# Patient Record
Sex: Female | Born: 1960 | Race: White | Hispanic: No | Marital: Single | Smoking: Current every day smoker
Health system: Southern US, Community
[De-identification: ages and names within clinical notes are randomized; demographics above are authoritative.]

## PROBLEM LIST (undated history)

## (undated) DIAGNOSIS — I10 Essential (primary) hypertension: Secondary | ICD-10-CM

## (undated) DIAGNOSIS — E876 Hypokalemia: Secondary | ICD-10-CM

## (undated) DIAGNOSIS — F419 Anxiety disorder, unspecified: Secondary | ICD-10-CM

## (undated) DIAGNOSIS — I219 Acute myocardial infarction, unspecified: Secondary | ICD-10-CM

## (undated) DIAGNOSIS — K219 Gastro-esophageal reflux disease without esophagitis: Secondary | ICD-10-CM

## (undated) DIAGNOSIS — B1920 Unspecified viral hepatitis C without hepatic coma: Secondary | ICD-10-CM

## (undated) DIAGNOSIS — R0902 Hypoxemia: Secondary | ICD-10-CM

## (undated) HISTORY — PX: TUBAL LIGATION: SHX77

## (undated) HISTORY — DX: Unspecified viral hepatitis C without hepatic coma: B19.20

## (undated) HISTORY — PX: CHOLECYSTECTOMY: SHX55

---

## 2012-03-02 ENCOUNTER — Other Ambulatory Visit: Payer: Self-pay

## 2012-03-02 ENCOUNTER — Inpatient Hospital Stay (HOSPITAL_COMMUNITY)
Admission: AD | Admit: 2012-03-02 | Discharge: 2012-03-15 | DRG: 234 | Disposition: A | Discharge status: Home / Self Care | Payer: Medicaid Other | Source: Other Acute Inpatient Hospital | Attending: Cardiothoracic Surgery | Admitting: Cardiothoracic Surgery

## 2012-03-02 ENCOUNTER — Inpatient Hospital Stay (HOSPITAL_COMMUNITY): Payer: Medicaid Other

## 2012-03-02 ENCOUNTER — Encounter (HOSPITAL_COMMUNITY)
Admission: AD | Disposition: A | Discharge status: Home / Self Care | Payer: Self-pay | Source: Other Acute Inpatient Hospital | Attending: Cardiothoracic Surgery

## 2012-03-02 ENCOUNTER — Encounter (HOSPITAL_COMMUNITY): Payer: Self-pay | Admitting: Pulmonary Disease

## 2012-03-02 DIAGNOSIS — E873 Alkalosis: Secondary | ICD-10-CM | POA: Diagnosis not present

## 2012-03-02 DIAGNOSIS — Z951 Presence of aortocoronary bypass graft: Secondary | ICD-10-CM

## 2012-03-02 DIAGNOSIS — I2510 Atherosclerotic heart disease of native coronary artery without angina pectoris: Secondary | ICD-10-CM

## 2012-03-02 DIAGNOSIS — I10 Essential (primary) hypertension: Secondary | ICD-10-CM | POA: Diagnosis present

## 2012-03-02 DIAGNOSIS — I9589 Other hypotension: Secondary | ICD-10-CM | POA: Diagnosis not present

## 2012-03-02 DIAGNOSIS — F419 Anxiety disorder, unspecified: Secondary | ICD-10-CM | POA: Diagnosis present

## 2012-03-02 DIAGNOSIS — Z8249 Family history of ischemic heart disease and other diseases of the circulatory system: Secondary | ICD-10-CM

## 2012-03-02 DIAGNOSIS — Z79899 Other long term (current) drug therapy: Secondary | ICD-10-CM

## 2012-03-02 DIAGNOSIS — E8779 Other fluid overload: Secondary | ICD-10-CM | POA: Diagnosis not present

## 2012-03-02 DIAGNOSIS — Z9861 Coronary angioplasty status: Secondary | ICD-10-CM

## 2012-03-02 DIAGNOSIS — I214 Non-ST elevation (NSTEMI) myocardial infarction: Principal | ICD-10-CM | POA: Diagnosis present

## 2012-03-02 DIAGNOSIS — T50995A Adverse effect of other drugs, medicaments and biological substances, initial encounter: Secondary | ICD-10-CM | POA: Diagnosis not present

## 2012-03-02 DIAGNOSIS — R0602 Shortness of breath: Secondary | ICD-10-CM

## 2012-03-02 DIAGNOSIS — J449 Chronic obstructive pulmonary disease, unspecified: Secondary | ICD-10-CM | POA: Diagnosis present

## 2012-03-02 DIAGNOSIS — Z7982 Long term (current) use of aspirin: Secondary | ICD-10-CM

## 2012-03-02 DIAGNOSIS — F411 Generalized anxiety disorder: Secondary | ICD-10-CM | POA: Diagnosis present

## 2012-03-02 DIAGNOSIS — D62 Acute posthemorrhagic anemia: Secondary | ICD-10-CM | POA: Diagnosis not present

## 2012-03-02 DIAGNOSIS — I209 Angina pectoris, unspecified: Secondary | ICD-10-CM

## 2012-03-02 DIAGNOSIS — R0609 Other forms of dyspnea: Secondary | ICD-10-CM | POA: Diagnosis present

## 2012-03-02 DIAGNOSIS — Z6837 Body mass index (BMI) 37.0-37.9, adult: Secondary | ICD-10-CM

## 2012-03-02 DIAGNOSIS — I480 Paroxysmal atrial fibrillation: Secondary | ICD-10-CM | POA: Diagnosis not present

## 2012-03-02 DIAGNOSIS — E119 Type 2 diabetes mellitus without complications: Secondary | ICD-10-CM

## 2012-03-02 DIAGNOSIS — J441 Chronic obstructive pulmonary disease with (acute) exacerbation: Secondary | ICD-10-CM | POA: Diagnosis not present

## 2012-03-02 DIAGNOSIS — E876 Hypokalemia: Secondary | ICD-10-CM | POA: Diagnosis not present

## 2012-03-02 DIAGNOSIS — IMO0001 Reserved for inherently not codable concepts without codable children: Secondary | ICD-10-CM | POA: Diagnosis present

## 2012-03-02 DIAGNOSIS — Y9210 Unspecified residential institution as the place of occurrence of the external cause: Secondary | ICD-10-CM | POA: Diagnosis not present

## 2012-03-02 DIAGNOSIS — J90 Pleural effusion, not elsewhere classified: Secondary | ICD-10-CM | POA: Diagnosis not present

## 2012-03-02 DIAGNOSIS — E669 Obesity, unspecified: Secondary | ICD-10-CM | POA: Diagnosis present

## 2012-03-02 DIAGNOSIS — I252 Old myocardial infarction: Secondary | ICD-10-CM

## 2012-03-02 DIAGNOSIS — I4891 Unspecified atrial fibrillation: Secondary | ICD-10-CM | POA: Diagnosis not present

## 2012-03-02 DIAGNOSIS — K219 Gastro-esophageal reflux disease without esophagitis: Secondary | ICD-10-CM | POA: Diagnosis present

## 2012-03-02 DIAGNOSIS — F17200 Nicotine dependence, unspecified, uncomplicated: Secondary | ICD-10-CM | POA: Diagnosis present

## 2012-03-02 HISTORY — PX: LEFT HEART CATHETERIZATION WITH CORONARY ANGIOGRAM: SHX5451

## 2012-03-02 HISTORY — DX: Anxiety disorder, unspecified: F41.9

## 2012-03-02 HISTORY — DX: Acute myocardial infarction, unspecified: I21.9

## 2012-03-02 HISTORY — PX: CARDIAC CATHETERIZATION: SHX172

## 2012-03-02 HISTORY — DX: Essential (primary) hypertension: I10

## 2012-03-02 HISTORY — DX: Gastro-esophageal reflux disease without esophagitis: K21.9

## 2012-03-02 LAB — HEMOGLOBIN A1C
Hgb A1c MFr Bld: 5.9 % — ABNORMAL HIGH (ref <5.7)
Mean Plasma Glucose: 123 mg/dL — ABNORMAL HIGH (ref <117)

## 2012-03-02 LAB — LIPID PANEL
Cholesterol: 171 mg/dL (ref 0–200)
HDL: 50 mg/dL (ref >39)
LDL Cholesterol: 107 mg/dL — ABNORMAL HIGH (ref 0–99)
Total CHOL/HDL Ratio: 3.4 RATIO
Triglycerides: 68 mg/dL (ref <150)
VLDL: 14 mg/dL (ref 0–40)

## 2012-03-02 LAB — COMPREHENSIVE METABOLIC PANEL
ALT: 19 U/L (ref 0–35)
AST: 35 U/L (ref 0–37)
CO2: 30 mEq/L (ref 19–32)
Calcium: 9 mg/dL (ref 8.4–10.5)
GFR calc non Af Amer: >90 mL/min (ref >90)
Potassium: 3.7 mEq/L (ref 3.5–5.1)
Sodium: 141 mEq/L (ref 135–145)
Total Protein: 7.1 g/dL (ref 6.0–8.3)

## 2012-03-02 LAB — TSH: TSH: 1.272 u[IU]/mL (ref 0.350–4.500)

## 2012-03-02 LAB — CBC
MCH: 29.5 pg (ref 26.0–34.0)
Platelets: 147 10*3/uL — ABNORMAL LOW (ref 150–400)
RBC: 4.64 MIL/uL (ref 3.87–5.11)

## 2012-03-02 LAB — GLUCOSE, CAPILLARY
Glucose-Capillary: 129 mg/dL — ABNORMAL HIGH (ref 70–99)
Glucose-Capillary: 167 mg/dL — ABNORMAL HIGH (ref 70–99)

## 2012-03-02 LAB — CARDIAC PANEL(CRET KIN+CKTOT+MB+TROPI)
CK, MB: 2.1 ng/mL (ref 0.3–4.0)
CK, MB: 1.9 ng/mL (ref 0.3–4.0)
Troponin I: 0.48 ng/mL (ref <0.30)
Troponin I: <0.3 ng/mL (ref <0.30)

## 2012-03-02 LAB — PRO B NATRIURETIC PEPTIDE: Pro B Natriuretic peptide (BNP): 199.2 pg/mL — ABNORMAL HIGH (ref 0–125)

## 2012-03-02 SURGERY — LEFT HEART CATHETERIZATION WITH CORONARY ANGIOGRAM
Anesthesia: LOCAL

## 2012-03-02 MED ORDER — POTASSIUM CHLORIDE 2 MEQ/ML IV SOLN
80.0000 meq | INTRAVENOUS | Status: DC
  Filled 2012-03-02: qty 40

## 2012-03-02 MED ORDER — HEPARIN (PORCINE) IN NACL 100-0.45 UNIT/ML-% IJ SOLN
950.0000 [IU]/h | INTRAMUSCULAR | Status: DC
  Administered 2012-03-02: 950 [IU]/h via INTRAVENOUS
  Filled 2012-03-02 (×2): qty 250

## 2012-03-02 MED ORDER — ALBUTEROL SULFATE (5 MG/ML) 0.5% IN NEBU
2.5000 mg | INHALATION_SOLUTION | Freq: Once | RESPIRATORY_TRACT | Status: AC
  Administered 2012-03-02: 2.5 mg via RESPIRATORY_TRACT

## 2012-03-02 MED ORDER — NITROGLYCERIN IN D5W 200-5 MCG/ML-% IV SOLN
2.0000 ug/min | INTRAVENOUS | Status: AC
  Administered 2012-03-03: 5 ug/min via INTRAVENOUS
  Filled 2012-03-02: qty 250

## 2012-03-02 MED ORDER — ONDANSETRON HCL 4 MG/2ML IJ SOLN: 4.0000 mg | Freq: Four times a day (QID) | INTRAMUSCULAR | Status: DC | PRN

## 2012-03-02 MED ORDER — IPRATROPIUM BROMIDE 0.02 % IN SOLN
0.5000 mg | Freq: Four times a day (QID) | RESPIRATORY_TRACT | Status: DC
  Administered 2012-03-02 – 2012-03-03 (×3): 0.5 mg via RESPIRATORY_TRACT
  Filled 2012-03-02 (×3): qty 2.5

## 2012-03-02 MED ORDER — SODIUM CHLORIDE 0.9 % IJ SOLN: 3.0000 mL | INTRAMUSCULAR | Status: DC | PRN

## 2012-03-02 MED ORDER — ASPIRIN 81 MG PO CHEW
81.0000 mg | CHEWABLE_TABLET | Freq: Every day | ORAL | Status: DC
  Administered 2012-03-02: 81 mg via ORAL

## 2012-03-02 MED ORDER — CHLORHEXIDINE GLUCONATE 4 % EX LIQD
60.0000 mL | Freq: Once | CUTANEOUS | Status: AC
  Administered 2012-03-03: 4 via TOPICAL
  Filled 2012-03-02: qty 60

## 2012-03-02 MED ORDER — HEPARIN BOLUS VIA INFUSION
4000.0000 [IU] | Freq: Once | INTRAVENOUS | Status: AC
  Administered 2012-03-02: 4000 [IU] via INTRAVENOUS
  Filled 2012-03-02: qty 4000

## 2012-03-02 MED ORDER — LEVOFLOXACIN IN D5W 750 MG/150ML IV SOLN
750.0000 mg | INTRAVENOUS | Status: DC
  Administered 2012-03-02: 750 mg via INTRAVENOUS
  Filled 2012-03-02: qty 150

## 2012-03-02 MED ORDER — TEMAZEPAM 15 MG PO CAPS
15.0000 mg | ORAL_CAPSULE | Freq: Once | ORAL | Status: AC | PRN
  Administered 2012-03-02: 15 mg via ORAL
  Filled 2012-03-02: qty 1

## 2012-03-02 MED ORDER — SODIUM CHLORIDE 0.9 % IV SOLN: 250.0000 mL | INTRAVENOUS | Status: DC | PRN

## 2012-03-02 MED ORDER — ALPRAZOLAM 0.5 MG PO TABS
1.0000 mg | ORAL_TABLET | Freq: Three times a day (TID) | ORAL | Status: DC | PRN
  Administered 2012-03-02: 1 mg via ORAL
  Filled 2012-03-02 (×2): qty 2

## 2012-03-02 MED ORDER — TRANEXAMIC ACID 100 MG/ML IV SOLN
1.5000 mg/kg/h | INTRAVENOUS | Status: AC
  Administered 2012-03-03: 1.5 mg/kg/h via INTRAVENOUS
  Filled 2012-03-02: qty 25

## 2012-03-02 MED ORDER — ACETAMINOPHEN 325 MG PO TABS: 650.0000 mg | ORAL_TABLET | ORAL | Status: DC | PRN

## 2012-03-02 MED ORDER — LIDOCAINE HCL (PF) 1 % IJ SOLN
INTRAMUSCULAR | Status: AC
  Filled 2012-03-02: qty 30

## 2012-03-02 MED ORDER — PHENYLEPHRINE HCL 10 MG/ML IJ SOLN
30.0000 ug/min | INTRAMUSCULAR | Status: DC
  Filled 2012-03-02: qty 2

## 2012-03-02 MED ORDER — DEXTROSE 5 % IV SOLN
1.5000 g | INTRAVENOUS | Status: AC
  Administered 2012-03-03: .75 g via INTRAVENOUS
  Administered 2012-03-03: 1.5 g via INTRAVENOUS
  Filled 2012-03-02: qty 1.5

## 2012-03-02 MED ORDER — VANCOMYCIN HCL 1000 MG IV SOLR
1500.0000 mg | INTRAVENOUS | Status: AC
  Administered 2012-03-03: 1500 mg via INTRAVENOUS
  Filled 2012-03-02: qty 1500

## 2012-03-02 MED ORDER — ASPIRIN 81 MG PO CHEW
CHEWABLE_TABLET | ORAL | Status: AC
  Administered 2012-03-02: 81 mg via ORAL
  Filled 2012-03-02: qty 3

## 2012-03-02 MED ORDER — ACETAMINOPHEN 650 MG RE SUPP: 650.0000 mg | Freq: Four times a day (QID) | RECTAL | Status: DC | PRN

## 2012-03-02 MED ORDER — ALBUTEROL SULFATE (5 MG/ML) 0.5% IN NEBU
2.5000 mg | INHALATION_SOLUTION | Freq: Four times a day (QID) | RESPIRATORY_TRACT | Status: DC
  Administered 2012-03-02 – 2012-03-03 (×3): 2.5 mg via RESPIRATORY_TRACT
  Filled 2012-03-02 (×3): qty 0.5

## 2012-03-02 MED ORDER — METOPROLOL TARTRATE 12.5 MG HALF TABLET
12.5000 mg | ORAL_TABLET | Freq: Two times a day (BID) | ORAL | Status: DC
  Administered 2012-03-02 (×2): 12.5 mg via ORAL
  Filled 2012-03-02 (×4): qty 1

## 2012-03-02 MED ORDER — NITROGLYCERIN 0.2 MG/ML ON CALL CATH LAB
INTRAVENOUS | Status: AC
  Filled 2012-03-02: qty 1

## 2012-03-02 MED ORDER — ALPRAZOLAM 0.5 MG PO TABS
1.0000 mg | ORAL_TABLET | Freq: Two times a day (BID) | ORAL | Status: DC | PRN
  Administered 2012-03-02: 1 mg via ORAL
  Filled 2012-03-02: qty 2

## 2012-03-02 MED ORDER — CHLORHEXIDINE GLUCONATE 4 % EX LIQD
CUTANEOUS | Status: AC
  Filled 2012-03-02: qty 60

## 2012-03-02 MED ORDER — ALPRAZOLAM 0.5 MG PO TABS
1.5000 mg | ORAL_TABLET | Freq: Three times a day (TID) | ORAL | Status: DC
  Administered 2012-03-02 (×2): 1.5 mg via ORAL
  Filled 2012-03-02 (×2): qty 3

## 2012-03-02 MED ORDER — TRANEXAMIC ACID (OHS) BOLUS VIA INFUSION
15.0000 mg/kg | INTRAVENOUS | Status: AC
  Administered 2012-03-03: 1314 mg via INTRAVENOUS
  Filled 2012-03-02: qty 1314

## 2012-03-02 MED ORDER — ASPIRIN EC 81 MG PO TBEC
81.0000 mg | DELAYED_RELEASE_TABLET | Freq: Every day | ORAL | Status: DC
  Administered 2012-03-02: 81 mg via ORAL
  Filled 2012-03-02 (×2): qty 1

## 2012-03-02 MED ORDER — HEPARIN (PORCINE) IN NACL 100-0.45 UNIT/ML-% IJ SOLN
950.0000 [IU]/h | INTRAMUSCULAR | Status: DC
  Filled 2012-03-02 (×2): qty 250

## 2012-03-02 MED ORDER — TRANEXAMIC ACID (OHS) PUMP PRIME SOLUTION
2.0000 mg/kg | INTRAVENOUS | Status: DC
  Filled 2012-03-02: qty 1.75

## 2012-03-02 MED ORDER — ENOXAPARIN SODIUM 40 MG/0.4ML ~~LOC~~ SOLN
40.0000 mg | Freq: Every day | SUBCUTANEOUS | Status: DC
  Filled 2012-03-02: qty 0.4

## 2012-03-02 MED ORDER — CLOPIDOGREL BISULFATE 75 MG PO TABS
75.0000 mg | ORAL_TABLET | Freq: Every day | ORAL | Status: DC
  Administered 2012-03-02: 75 mg via ORAL
  Filled 2012-03-02 (×2): qty 1

## 2012-03-02 MED ORDER — SODIUM CHLORIDE 0.9 % IV SOLN
INTRAVENOUS | Status: DC
  Administered 2012-03-02: 18:00:00 via INTRAVENOUS

## 2012-03-02 MED ORDER — SODIUM CHLORIDE 0.9 % IV SOLN: INTRAVENOUS | Status: DC

## 2012-03-02 MED ORDER — ACETAMINOPHEN 325 MG PO TABS: 650.0000 mg | ORAL_TABLET | Freq: Four times a day (QID) | ORAL | Status: DC | PRN

## 2012-03-02 MED ORDER — SODIUM CHLORIDE 0.9 % IV SOLN
INTRAVENOUS | Status: AC
  Administered 2012-03-03: 1 [IU]/h via INTRAVENOUS
  Filled 2012-03-02: qty 1

## 2012-03-02 MED ORDER — ONDANSETRON HCL 4 MG PO TABS: 4.0000 mg | ORAL_TABLET | Freq: Four times a day (QID) | ORAL | Status: DC | PRN

## 2012-03-02 MED ORDER — HYDROCODONE-ACETAMINOPHEN 5-325 MG PO TABS: 1.0000 | ORAL_TABLET | ORAL | Status: DC | PRN

## 2012-03-02 MED ORDER — ASPIRIN 81 MG PO CHEW: 324.0000 mg | CHEWABLE_TABLET | ORAL | Status: DC

## 2012-03-02 MED ORDER — HEPARIN (PORCINE) IN NACL 2-0.9 UNIT/ML-% IJ SOLN
INTRAMUSCULAR | Status: AC
  Filled 2012-03-02: qty 1000

## 2012-03-02 MED ORDER — PANTOPRAZOLE SODIUM 40 MG PO TBEC
40.0000 mg | DELAYED_RELEASE_TABLET | Freq: Every day | ORAL | Status: DC
  Administered 2012-03-02: 40 mg via ORAL
  Filled 2012-03-02: qty 1

## 2012-03-02 MED ORDER — FENTANYL CITRATE 0.05 MG/ML IJ SOLN
INTRAMUSCULAR | Status: AC
  Filled 2012-03-02: qty 2

## 2012-03-02 MED ORDER — EPINEPHRINE HCL 1 MG/ML IJ SOLN
0.5000 ug/min | INTRAVENOUS | Status: DC
  Filled 2012-03-02: qty 4

## 2012-03-02 MED ORDER — BUDESONIDE 0.5 MG/2ML IN SUSP
0.5000 mg | Freq: Two times a day (BID) | RESPIRATORY_TRACT | Status: DC
  Administered 2012-03-02 – 2012-03-15 (×24): 0.5 mg via RESPIRATORY_TRACT
  Filled 2012-03-02 (×30): qty 2

## 2012-03-02 MED ORDER — GUAIFENESIN ER 600 MG PO TB12
600.0000 mg | ORAL_TABLET | Freq: Two times a day (BID) | ORAL | Status: DC
  Administered 2012-03-02 (×2): 600 mg via ORAL
  Filled 2012-03-02 (×5): qty 1

## 2012-03-02 MED ORDER — LEVALBUTEROL HCL 0.63 MG/3ML IN NEBU
0.6300 mg | INHALATION_SOLUTION | Freq: Four times a day (QID) | RESPIRATORY_TRACT | Status: DC | PRN
  Administered 2012-03-07: 0.63 mg via RESPIRATORY_TRACT
  Filled 2012-03-02: qty 3

## 2012-03-02 MED ORDER — NITROGLYCERIN 0.1 MG/HR TD PT24
0.1000 mg | MEDICATED_PATCH | Freq: Every day | TRANSDERMAL | Status: DC
  Administered 2012-03-02: 0.1 mg via TRANSDERMAL
  Filled 2012-03-02 (×2): qty 1

## 2012-03-02 MED ORDER — NICOTINE 21 MG/24HR TD PT24
21.0000 mg | MEDICATED_PATCH | Freq: Every day | TRANSDERMAL | Status: DC
  Administered 2012-03-02 – 2012-03-15 (×13): 21 mg via TRANSDERMAL
  Filled 2012-03-02 (×14): qty 1

## 2012-03-02 MED ORDER — LORAZEPAM 2 MG/ML IJ SOLN: 1.0000 mg | INTRAMUSCULAR | Status: DC | PRN

## 2012-03-02 MED ORDER — METHYLPREDNISOLONE SODIUM SUCC 125 MG IJ SOLR
80.0000 mg | Freq: Four times a day (QID) | INTRAMUSCULAR | Status: DC
  Administered 2012-03-02: 80 mg via INTRAVENOUS
  Filled 2012-03-02 (×4): qty 1.28
  Filled 2012-03-02: qty 2

## 2012-03-02 MED ORDER — MORPHINE SULFATE 4 MG/ML IJ SOLN: 1.0000 mg | INTRAMUSCULAR | Status: DC | PRN

## 2012-03-02 MED ORDER — INSULIN GLARGINE 100 UNIT/ML ~~LOC~~ SOLN: 10.0000 [IU] | Freq: Every day | SUBCUTANEOUS | Status: DC

## 2012-03-02 MED ORDER — BISACODYL 5 MG PO TBEC: 5.0000 mg | DELAYED_RELEASE_TABLET | Freq: Once | ORAL | Status: DC

## 2012-03-02 MED ORDER — INSULIN ASPART 100 UNIT/ML ~~LOC~~ SOLN
0.0000 [IU] | Freq: Three times a day (TID) | SUBCUTANEOUS | Status: DC
Start: 2012-03-02 — End: 2012-03-03
  Administered 2012-03-02 (×2): 1 [IU] via SUBCUTANEOUS

## 2012-03-02 MED ORDER — MORPHINE SULFATE 2 MG/ML IJ SOLN: 2.0000 mg | INTRAMUSCULAR | Status: DC | PRN

## 2012-03-02 MED ORDER — DEXTROSE 5 % IV SOLN
750.0000 mg | INTRAVENOUS | Status: DC
  Filled 2012-03-02: qty 750

## 2012-03-02 MED ORDER — METOPROLOL TARTRATE 12.5 MG HALF TABLET
12.5000 mg | ORAL_TABLET | Freq: Once | ORAL | Status: AC
  Administered 2012-03-03: 12.5 mg via ORAL
  Filled 2012-03-02 (×2): qty 1

## 2012-03-02 MED ORDER — MAGNESIUM SULFATE 50 % IJ SOLN
40.0000 meq | INTRAMUSCULAR | Status: DC
  Filled 2012-03-02: qty 10

## 2012-03-02 MED ORDER — DIAZEPAM 5 MG PO TABS
5.0000 mg | ORAL_TABLET | Freq: Once | ORAL | Status: AC
  Administered 2012-03-03: 5 mg via ORAL
  Filled 2012-03-02: qty 1

## 2012-03-02 MED ORDER — SODIUM CHLORIDE 0.9 % IJ SOLN
3.0000 mL | Freq: Two times a day (BID) | INTRAMUSCULAR | Status: DC
  Administered 2012-03-02: 3 mL via INTRAVENOUS

## 2012-03-02 MED ORDER — ALBUTEROL SULFATE (5 MG/ML) 0.5% IN NEBU: 2.5000 mg | INHALATION_SOLUTION | RESPIRATORY_TRACT | Status: DC | PRN

## 2012-03-02 MED ORDER — DOPAMINE-DEXTROSE 3.2-5 MG/ML-% IV SOLN
2.0000 ug/kg/min | INTRAVENOUS | Status: AC
  Administered 2012-03-03: 3 ug/kg/min via INTRAVENOUS
  Filled 2012-03-02: qty 250

## 2012-03-02 MED ORDER — DOCUSATE SODIUM 100 MG PO CAPS
100.0000 mg | ORAL_CAPSULE | Freq: Two times a day (BID) | ORAL | Status: DC
  Administered 2012-03-02: 100 mg via ORAL
  Filled 2012-03-02 (×4): qty 1

## 2012-03-02 MED ORDER — SODIUM BICARBONATE 8.4 % IV SOLN
INTRAVENOUS | Status: AC
  Administered 2012-03-03: 10:00:00
  Filled 2012-03-02: qty 2.5

## 2012-03-02 MED ORDER — SODIUM CHLORIDE 0.9 % IJ SOLN
3.0000 mL | Freq: Two times a day (BID) | INTRAMUSCULAR | Status: DC
  Administered 2012-03-02 (×2): 3 mL via INTRAVENOUS

## 2012-03-02 MED ORDER — DEXMEDETOMIDINE HCL 100 MCG/ML IV SOLN
0.1000 ug/kg/h | INTRAVENOUS | Status: AC
  Administered 2012-03-03: .2 ug/kg/h via INTRAVENOUS
  Filled 2012-03-02: qty 4

## 2012-03-02 MED ORDER — MIDAZOLAM HCL 2 MG/2ML IJ SOLN
INTRAMUSCULAR | Status: AC
  Filled 2012-03-02: qty 2

## 2012-03-02 NOTE — Progress Notes (Signed)
ANTICOAGULATION CONSULT NOTE - Initial Consult  Pharmacy Consult for heparin Indication: s/p cath; left main disease, CABG evaluation pending  No Known Allergies  Patient Measurements: Height: 5\' 1"  (154.9 cm) Weight: 193 lb 2 oz (87.6 kg) IBW/kg (Calculated) : 47.8  Heparin Dosing Weight: 68  Vital Signs: Temp: 98.9 F (37.2 C) (05/22 1218) Temp src: Oral (05/22 1218) BP: 81/60 mmHg (05/22 1545) Pulse Rate: 85  (05/22 1545)  Labs:  Basename 03/02/12 1140 03/02/12 0415  HGB -- 13.7  HCT -- 43.1  PLT -- 147*  APTT -- --  LABPROT -- 14.2  INR -- 1.08  HEPARINUNFRC -- --  CREATININE -- 0.40*  CKTOTAL 29 29  CKMB 2.2 2.1  TROPONINI <0.30 0.48*    Estimated Creatinine Clearance: 83.7 ml/min (by C-G formula based on Cr of 0.4).   Medical History: Past Medical History  Diagnosis Date  . Hypertension   . Anxiety   . GERD (gastroesophageal reflux disease)   . Myocardial infarction     2001  . Diabetes mellitus     type 2    Medications:  Prescriptions prior to admission  Medication Sig Dispense Refill  . ALPRAZolam (XANAX) 1 MG tablet Take 1.5 mg by mouth 3 (three) times daily.        Assessment: 51 yo with a h/o CAD who underwent cath procedure today and found to have high-grade distal left main disease requiring CABG.  TCTS consult pending.  To restart IV heparin 3 hours after radial sheath removed.  Pt received Heparin 4000 unit bolus, 950 units/hr from ~ 0930-1330. Additional Heparin 4000 unit bolus given in the cath lab. Sheath removed ~ 1435.  Goal of Therapy:  Heparin level 0.3-0.7 units/ml Monitor platelets by anticoagulation protocol: Yes   Plan:  1. Restart heparin infusion at 950 units/hr (at ~ 1730 tonight) 2. F/u with 8 hr heparin level (~ 0130 tomorrow morning) 3. F/u TCTS recommendations regarding surgery.  [employer], Pharm.D., BCPS Clinical Pharmacist Pager [phone] 03/02/2012 4:33 PM

## 2012-03-02 NOTE — Plan of Care (Signed)
Problem: Consults Goal: Cardiac Surgery Patient Education ( See Patient Education module for education specifics.)  Outcome: Completed/Met Date Met:  03/02/12 Pt received cardiac surgery booklet. Pt and sister watched videos.

## 2012-03-02 NOTE — Progress Notes (Signed)
PFT done at bedside. Unconfirmed copy placed in shadow chart.  Inocente Salles RRT, RCP 03/02/2012 4:36 PM

## 2012-03-02 NOTE — Progress Notes (Signed)
Reason for Consult: Dyspnea, positive Troponin  Requesting Physician: [facility]  HPI: This is a 51 y.o. female with a past medical history significant for a history of cath/ ? Stent at [facility] in 2001, no details available. She is unemployed and her fiance that she lived with died of liver cancer in 05/16/2011. She has no insurance and has not taken any medications except antianxiety Rx for years. She smokes 2ppd. Fortunately she has not had any further cardiac problems till recently. Yesterday she went to the hospital to visit her mother when an RN on the floor became concerned because of Ms Cork wheezing. She was evaluated in the ER and her Troponin was 0.83, she had no acute changes on her EKG. She does admit to a recent history of exertional back back which is similar to her pre-PCI symptoms in 2001. Apparently no beds at [facility], or [facility] and the patient was transferred here from [facility]. She is currently breathing well and her second Troponin is 0.43.   PMHx:  Past Medical History  Diagnosis Date  . Hypertension   . Anxiety   . GERD (gastroesophageal reflux disease)   . Myocardial infarction     2001  . Diabetes mellitus     type 2   Past Surgical History  Procedure Date  . Cardiac catheterization     2001  . Cholecystectomy   . Tubal ligation     FAMHx: Family History  Problem Relation Age of Onset  . Coronary artery disease Father 59    MI/CABG    SOCHx:  reports that she has been smoking Cigarettes.  She has a 60 pack-year smoking history. She does not have any smokeless tobacco history on file. She reports that she does not drink alcohol or use illicit drugs.  ALLERGIES: No Known Allergies  ROS: No history of GI bleeding. She denies any history of diabetes or dyslipidemia. She has a long history of chronic anxiety  HOME MEDICATIONS: Prescriptions prior to admission  Medication Sig Dispense Refill  . ALPRAZolam (XANAX) 1 MG tablet  Take 1.5 mg by mouth 3 (three) times daily.        HOSPITAL MEDICATIONS: I have reviewed the patient's current medications.  VITALS: Blood pressure 99/54, pulse 81, temperature 97.9 F (36.6 C), temperature source Oral, resp. rate 28, height 5\' 1"  (1.549 m), weight 88.2 kg (194 lb 7.1 oz), SpO2 95.00%.  PHYSICAL EXAM: General appearance: alert, cooperative, no distress and morbidly obese Neck: no carotid bruit, no JVD, supple, symmetrical, trachea midline and thyroid not enlarged, symmetric, no tenderness/mass/nodules Lungs: decreased breath sounds overall with faint expiratory wheezing Heart: regular rate and rhythm, S1, S2 normal, no murmur, click, rub or gallop Abdomen: morbidly obese, not distended, non tender Extremities: no edema Pulses: 2+ and symmetric Skin: Skin color, texture, turgor normal. No rashes or lesions Neurologic: Grossly normal  LABS: Results for orders placed during the hospital encounter of 03/02/12 (from the past 48 hour(s))  MRSA PCR SCREENING     Status: Normal   Collection Time   03/02/12  2:48 AM      Component Value Range Comment   MRSA by PCR NEGATIVE  NEGATIVE    CBC     Status: Abnormal   Collection Time   03/02/12  4:15 AM      Component Value Range Comment   WBC 9.7  4.0 - 10.5 (K/uL)    RBC 4.64  3.87 - 5.11 (MIL/uL)  Hemoglobin 13.7  12.0 - 15.0 (g/dL)    HCT 16.1  09.6 - 04.5 (%)    MCV 92.9  78.0 - 100.0 (fL)    MCH 29.5  26.0 - 34.0 (pg)    MCHC 31.8  30.0 - 36.0 (g/dL)    RDW 40.9  81.1 - 91.4 (%)    Platelets 147 (*) 150 - 400 (K/uL)   COMPREHENSIVE METABOLIC PANEL     Status: Abnormal   Collection Time   03/02/12  4:15 AM      Component Value Range Comment   Sodium 141  135 - 145 (mEq/L)    Potassium 3.7  3.5 - 5.1 (mEq/L)    Chloride 104  96 - 112 (mEq/L)    CO2 30  19 - 32 (mEq/L)    Glucose, Bld 106 (*) 70 - 99 (mg/dL)    BUN 8  6 - 23 (mg/dL)    Creatinine, Ser 7.82 (*) 0.50 - 1.10 (mg/dL)    Calcium 9.0  8.4 - 10.5  (mg/dL)    Total Protein 7.1  6.0 - 8.3 (g/dL)    Albumin 3.0 (*) 3.5 - 5.2 (g/dL)    AST 35  0 - 37 (U/L)    ALT 19  0 - 35 (U/L)    Alkaline Phosphatase 96  39 - 117 (U/L)    Total Bilirubin 0.6  0.3 - 1.2 (mg/dL)    GFR calc non Af Amer >90  >90 (mL/min)    GFR calc Af Amer >90  >90 (mL/min)   MAGNESIUM     Status: Normal   Collection Time   03/02/12  4:15 AM      Component Value Range Comment   Magnesium 2.1  1.5 - 2.5 (mg/dL)   PHOSPHORUS     Status: Normal   Collection Time   03/02/12  4:15 AM      Component Value Range Comment   Phosphorus 4.5  2.3 - 4.6 (mg/dL)   PRO B NATRIURETIC PEPTIDE     Status: Abnormal   Collection Time   03/02/12  4:15 AM      Component Value Range Comment   Pro B Natriuretic peptide (BNP) 199.2 (*) 0 - 125 (pg/mL)   CARDIAC PANEL(CRET KIN+CKTOT+MB+TROPI)     Status: Abnormal   Collection Time   03/02/12  4:15 AM      Component Value Range Comment   Total CK 29  7 - 177 (U/L)    CK, MB 2.1  0.3 - 4.0 (ng/mL)    Troponin I 0.48 (*) <0.30 (ng/mL)    Relative Index RELATIVE INDEX IS INVALID  0.0 - 2.5    PROTIME-INR     Status: Normal   Collection Time   03/02/12  4:15 AM      Component Value Range Comment   Prothrombin Time 14.2  11.6 - 15.2 (seconds)    INR 1.08  0.00 - 1.49    GLUCOSE, CAPILLARY     Status: Abnormal   Collection Time   03/02/12  7:27 AM      Component Value Range Comment   Glucose-Capillary 129 (*) 70 - 99 (mg/dL)    Comment 1 Notify RN      Comment 2 Documented in Chart       IMAGING: No results found. CXR done at [facility]  IMPRESSION: Principal Problem:  *Exertional dyspnea Active Problems:  NSTEMI, Troponin 0.83 on admission to [facility]  CAD, ? Stent at [facility] in  2001  COPD (chronic obstructive pulmonary disease)  Anxiety disorder  GERD (gastroesophageal reflux disease)  Smoker, 2ppd  Obesity   RECOMMENDATION: Appreciate Dr Maxwell Caul help, we will take on our service. The pt has no history of  diabetes and her BS was 106, so will d/c Lantus, continue sliding scale, check Hgb A1c.  Currently her respiratory status is stable on inhalers so will stop steroids for now. There is no sign if infection, WBC WNL and no fever, so will hold off on Antibiotics for now. She needs cath. Will change DVT Lovenox to IV Heparin and discuss timing with MD. No need for a 2D at this point if she is going for cath. MD to see.  Time Spent Directly with Patient: 40 minutes  KILROY,LUKE K 03/02/2012, 8:16 AM   Agree with note written by Corine Shelter [facility]  Admitted with SOB, back pain and + Trop c/w NSTEMI. H/O remote stent. Exam benign. EKG w/o acute changes. Labs OK. Feel cath best option to sort out symptoms and define anatomy in light of + enzymes. Risks and benefits explained.  Runell Gess 03/02/2012 10:09 AM

## 2012-03-02 NOTE — Progress Notes (Signed)
ANTICOAGULATION CONSULT NOTE - Initial Consult  Pharmacy Consult for heparin Indication: chest pain/ACS  No Known Allergies  Patient Measurements: Height: 5\' 1"  (154.9 cm) Weight: 194 lb 7.1 oz (88.2 kg) IBW/kg (Calculated) : 47.8  Heparin Dosing Weight: 68  Vital Signs: Temp: 97.9 F (36.6 C) (05/22 0732) Temp src: Oral (05/22 0732) BP: 99/54 mmHg (05/22 0430) Pulse Rate: 81  (05/22 0430)  Labs:  Basename 03/02/12 0415  HGB 13.7  HCT 43.1  PLT 147*  APTT --  LABPROT 14.2  INR 1.08  HEPARINUNFRC --  CREATININE 0.40*  CKTOTAL 29  CKMB 2.1  TROPONINI 0.48*    Estimated Creatinine Clearance: 84.1 ml/min (by C-G formula based on Cr of 0.4).   Medical History: Past Medical History  Diagnosis Date  . Hypertension   . Anxiety   . GERD (gastroesophageal reflux disease)   . Myocardial infarction     2001  . Diabetes mellitus     type 2    Medications:  Prescriptions prior to admission  Medication Sig Dispense Refill  . ALPRAZolam (XANAX) 1 MG tablet Take 1.5 mg by mouth 3 (three) times daily.        Assessment: 51 yo with a h/o CAD who was tx from [place] [facility] to eval for MI. She had a MI about 12 yrs ago. IV heparin has been ordered for anticoagulation.   Goal of Therapy:  Heparin level 0.3-0.7 units/ml Monitor platelets by anticoagulation protocol: Yes   Plan:  1. Heparin bolus 4000 units x1 2. Heparin drip at 950 units/hr 3. F/u with 6 hr heparin level  [place]ia [place] 03/02/2012,8:25 AM

## 2012-03-02 NOTE — Consult Note (Signed)
[address]            [place] [address]          [phone]       [name] [facility] Medical Record #[id number] Date of Birth: Jul 26, 1961  Referring: No ref. provider found Primary Care: Chauncy Passy, MD, MD  Chief Complaint:   No chief complaint on file.  chest pain shortness of breath decreased exercise tolerance over the past few days  History of Present Illness:     The patient is a 51 year old female diabetic smoker from person [facility] and proximal [place] transferred to this hospital for non-ST elevation MI, unstable angina and prior history of coronary disease status post LAD stent at [facility] in 2001. The patient has had exertional dyspnea and more recently chest tightness relieved by rest. This is been getting worse over the recent few days and the patient was presented to the emergency room and rocks [facility] were her troponins were elevated. She was transferred to this facility and underwent urgent cardiac catheterization by Dr. York Ram. The findings at catheterization demonstrated severe mid left main stenosis, 95%, and the right coronary was small and nondominant. Left jugular function is fairly well preserved. There is no evidence of mitral rotation or aortic stenosis.   Current Activity/ Functional Status: The patient was placed on IV heparin and is maintained pain-free. She's cath through the right radial artery without complication. She is unemployed and lives alone and has a stressful family situation.   Past Medical History  Diagnosis Date  . Hypertension   . Anxiety   . GERD (gastroesophageal reflux disease)   . Myocardial infarction     2001  . Diabetes mellitus     type 2    Past Surgical History  Procedure Date  . Cardiac catheterization     2001  . Cholecystectomy   . Tubal ligation     History  Smoking status  . Current Everyday Smoker -- 2.0  packs/day for 30 years  . Types: Cigarettes  Smokeless tobacco  . Not on file    History  Alcohol Use No    History   Social History  . Marital Status: Single    Spouse Name: N/A    Number of Children: 1  . Years of Education: N/A   Occupational History  . Not on file.   Social History Main Topics  . Smoking status: Current Everyday Smoker -- 2.0 packs/day for 30 years    Types: Cigarettes  . Smokeless tobacco: Not on file  . Alcohol Use: No  . Drug Use: No  . Sexually Active: Yes   Other Topics Concern  . Not on file   Social History Narrative   Single, unemployed, no insurance. She had lived with her fiance who died in 2023/05/31 of liver cancer. She has been on no medication except Xanax for years.    No Known Allergies  Current Facility-Administered Medications  Medication Dose Route Frequency Provider Last Rate Last Dose  . 0.9 %  sodium chloride infusion   Intravenous Continuous Runell Gess, MD 75 mL/hr at 03/02/12 1736    . acetaminophen (TYLENOL) tablet 650 mg  650 mg Oral Q6H PRN Rometta Emery, MD       Or  . acetaminophen (TYLENOL) suppository 650 mg  650 mg Rectal Q6H PRN  Rometta Emery, MD      . acetaminophen (TYLENOL) tablet 650 mg  650 mg Oral Q4H PRN Runell Gess, MD      . albuterol (PROVENTIL) (5 MG/ML) 0.5% nebulizer solution 2.5 mg  2.5 mg Nebulization Q6H Rometta Emery, MD   2.5 mg at 03/02/12 2004  . albuterol (PROVENTIL) (5 MG/ML) 0.5% nebulizer solution 2.5 mg  2.5 mg Nebulization Once Loreli Slot, MD   2.5 mg at 03/02/12 1602  . ALPRAZolam Prudy Feeler) tablet 1 mg  1 mg Oral TID PRN Abelino Derrick, PA   1 mg at 03/02/12 1329  . ALPRAZolam Prudy Feeler) tablet 1.5 mg  1.5 mg Oral TID Runell Gess, MD   1.5 mg at 03/02/12 1734  . aspirin chewable tablet 81 mg  81 mg Oral Daily Runell Gess, MD   81 mg at 03/02/12 1400  . aspirin EC tablet 81 mg  81 mg Oral Daily Rometta Emery, MD   81 mg at 03/02/12 1049  . docusate sodium  (COLACE) capsule 100 mg  100 mg Oral BID Rometta Emery, MD   100 mg at 03/02/12 1045  . fentaNYL (SUBLIMAZE) 0.05 MG/ML injection           . guaiFENesin (MUCINEX) 12 hr tablet 600 mg  600 mg Oral BID Rometta Emery, MD   600 mg at 03/02/12 1044  . heparin 2-0.9 UNIT/ML-% infusion           . heparin ADULT infusion 100 units/mL (25000 units/250 mL)  950 Units/hr Intravenous Continuous Kimberly Ballard Hammons, PHARMD 9.5 mL/hr at 03/02/12 1740 950 Units/hr at 03/02/12 1740  . heparin bolus via infusion 4,000 Units  4,000 Units Intravenous Once Runell Gess, MD   4,000 Units at 03/02/12 0900  . insulin aspart (novoLOG) injection 0-9 Units  0-9 Units Subcutaneous TID WC Rometta Emery, MD   1 Units at 03/02/12 1235  . ipratropium (ATROVENT) nebulizer solution 0.5 mg  0.5 mg Nebulization Q6H Rometta Emery, MD   0.5 mg at 03/02/12 2004  . levalbuterol (XOPENEX) nebulizer solution 0.63 mg  0.63 mg Nebulization Q6H PRN Abelino Derrick, PA      . lidocaine (XYLOCAINE) 1 % injection           . metoprolol tartrate (LOPRESSOR) tablet 12.5 mg  12.5 mg Oral BID Rometta Emery, MD   12.5 mg at 03/02/12 1045  . midazolam (VERSED) 2 MG/2ML injection           . morphine 4 MG/ML injection 1 mg  1 mg Intravenous Q1H PRN Runell Gess, MD      . nicotine (NICODERM CQ - dosed in mg/24 hours) patch 21 mg  21 mg Transdermal Daily Rometta Emery, MD   21 mg at 03/02/12 1047  . nitroGLYCERIN (NITRODUR - Dosed in mg/24 hr) patch 0.1 mg  0.1 mg Transdermal Daily Rometta Emery, MD   0.1 mg at 03/02/12 1045  . nitroGLYCERIN (NTG ON-CALL) 0.2 mg/mL injection           . ondansetron (ZOFRAN) tablet 4 mg  4 mg Oral Q6H PRN Rometta Emery, MD       Or  . ondansetron (ZOFRAN) injection 4 mg  4 mg Intravenous Q6H PRN Rometta Emery, MD      . ondansetron (ZOFRAN) injection 4 mg  4 mg Intravenous Q6H PRN Runell Gess, MD      .  pantoprazole (PROTONIX) EC tablet 40 mg  40 mg Oral Q1200 Abelino Derrick,  PA   40 mg at 03/02/12 1242  . DISCONTD: 0.9 %  sodium chloride infusion  250 mL Intravenous PRN Rometta Emery, MD      . DISCONTD: 0.9 %  sodium chloride infusion  250 mL Intravenous PRN Abelino Derrick, PA      . DISCONTD: 0.9 %  sodium chloride infusion  250 mL Intravenous PRN Abelino Derrick, PA      . DISCONTD: albuterol (PROVENTIL) (5 MG/ML) 0.5% nebulizer solution 2.5 mg  2.5 mg Nebulization Q2H PRN Rometta Emery, MD      . DISCONTD: ALPRAZolam Prudy Feeler) tablet 1 mg  1 mg Oral BID PRN Caroline More, NP   1 mg at 03/02/12 0518  . DISCONTD: aspirin chewable tablet 324 mg  324 mg Oral [address] [place], [place]      . DISCONTD: clopidogrel (PLAVIX) tablet 75 mg  75 mg Oral Q breakfast Rometta Emery, MD   75 mg at 03/02/12 0801  . DISCONTD: enoxaparin (LOVENOX) injection 40 mg  40 mg Subcutaneous Daily Rometta Emery, MD      . DISCONTD: heparin ADULT infusion 100 units/mL (25000 units/250 mL)  950 Units/hr Intravenous Continuous Runell Gess, MD   950 Units/hr at 03/02/12 1330  . DISCONTD: HYDROcodone-acetaminophen (NORCO) 5-325 MG per tablet 1-2 tablet  1-2 tablet Oral Q4H PRN Rometta Emery, MD      . DISCONTD: insulin glargine (LANTUS) injection 10 Units  10 Units Subcutaneous QHS Rometta Emery, MD      . DISCONTD: levofloxacin (LEVAQUIN) IVPB 750 mg  750 mg Intravenous Q24H Rometta Emery, MD   750 mg at 03/02/12 0518  . DISCONTD: LORazepam (ATIVAN) injection 1 mg  1 mg Intravenous Q4H PRN Rometta Emery, MD      . DISCONTD: methylPREDNISolone sodium succinate (SOLU-MEDROL) 125 mg/2 mL injection 80 mg  80 mg Intravenous Q6H Rometta Emery, MD   80 mg at 03/02/12 0519  . DISCONTD: morphine 2 MG/ML injection 2 mg  2 mg Intravenous Q4H PRN Rometta Emery, MD      . DISCONTD: sodium chloride 0.9 % injection 3 mL  3 mL Intravenous Q12H Rometta Emery, MD   3 mL at 03/02/12 1049  . DISCONTD: sodium chloride 0.9 % injection 3 mL  3 mL Intravenous Q12H Rometta Emery, MD    3 mL at 03/02/12 1049  . DISCONTD: sodium chloride 0.9 % injection 3 mL  3 mL Intravenous PRN Rometta Emery, MD      . DISCONTD: sodium chloride 0.9 % injection 3 mL  3 mL Intravenous PRN Abelino Derrick, PA        Prescriptions prior to admission  Medication Sig Dispense Refill  . ALPRAZolam (XANAX) 1 MG tablet Take 1.5 mg by mouth 3 (three) times daily.        Family History  Problem Relation Age of Onset  . Coronary artery disease Father 75    MI/CABG     Review of Systems:  The patient was given 75 mg of Plavix in the emergency department but no more. The patient has a significant anxiety disorder and is on Xanax at least 3 times a day at home. The patient denies previous thoracic trauma. She does actually smoker and has chronic bronchitis and exertional wheezing. She denies diabetes but her hemoglobin A1c is pending.  No history DVT claudication or peripheral vascular disease.  Surgical history is positive for previous cholecystectomy without complication.     Cardiac Review of Systems: Y or N  Chest Pain Gilian.Kraft    ]  Resting SOB [   ] Exertional SOB  [Y  ]  Orthopnea [  ]   Pedal Edema [ N  ]    Palpitations [  ] Syncope  [  ]   Presyncope [ N  ]  General Review of Systems: [Y] = yes [  ]=no Constitional: recent weight change [  ]; anorexia [  ]; fatigue [  ]; nausea [  ]; night sweats [  ]; fever [  ]; or chills [  ];                                                                                                                                          Dental: poor dentition[  ]; Last Dentist visit:>1 yr  Eye : blurred vision [  ]; diplopia [   ]; vision changes [  ];  Amaurosis fugax[  ]; Resp: cough [  ];  wheezing[  ];  hemoptysis[  ]; shortness of breathy[  y]; paroxysmal nocturnal dyspnea[  ]; dyspnea on exertion[ y ]; or orthopnea[  ];  GI:  gallstones[y  ], vomiting[  ];  dysphagia[  ]; melena[  ];  hematochezia [  ]; heartburn[  ];   Hx of  Colonoscopy[  ]; GU: kidney  stones [  ]; hematuria[  ];   dysuria [  ];  nocturia[  ];  history of     obstruction [  ];             Skin: rash, swelling[n  ];, hair loss[  ];  peripheral edema[  ];  or itching[  ]; Musculosketetal: myalgias[  ];  joint swelling[  ];  joint erythema[  ];  joint pain[  ];  back pain[  ];  Heme/Lymph: bruising[  ];  bleeding[n  ];  anemia[  ];  Neuro: TIA[  ];  headaches[  ];  stroke[  ];  vertigo[  ];  seizures[  ];   paresthesias[  ];  difficulty walking[  ];  Psych:depression[  ]; anxiety[  ];  Endocrine: diabetes[  ];  thyroid dysfunction[  ];  Immunizations: Flu [  ]; Pneumococcal[  ];  Other:  Physical Exam: BP 93/76  Pulse 103  Temp(Src) 98 F (36.7 C) (Oral)  Resp 20  Ht 5\' 1"  (1.549 m)  Wt 193 lb 2 oz (87.6 kg)  BMI 36.49 kg/m2  SpO2 93%  Physical exam Gen. appearance middle-aged obese female anxious but responsive HEENT normocephalic pupils equal Neck no JVD mass or bruit Thorax scattered wheezes no deformity Cardiac regular rhythm without murmur or gallop Abdomen soft nontender without pulsatile mass Extremities no edema cyanosis or  tenderness Neurologic intact   Diagnostic Studies & Laboratory data:   Cardiac catheterization, coronary care grams reviewed with Dr. Allyson Sabal agree with impression of 95% stenosis of mid left main.  Recent Radiology Findings:   X-ray Chest Pa And Lateral   03/02/2012  *RADIOLOGY REPORT*  Clinical Data: Shortness of breath and cough.  CHEST - 2 VIEW  Comparison: None.  Findings: Trachea is midline.  Heart size normal.  Lungs are clear. No pleural fluid.  IMPRESSION: No acute findings.  Original Report Authenticated By: Reyes Ivan, M.D.      Recent Lab Findings: Lab Results  Component Value Date   WBC 9.7 03/02/2012   HGB 13.7 03/02/2012   HCT 43.1 03/02/2012   PLT 147* 03/02/2012   GLUCOSE 106* 03/02/2012   CHOL 171 03/02/2012   TRIG 68 03/02/2012   HDL 50 03/02/2012   LDLCALC 107* 03/02/2012   ALT 19 03/02/2012   AST 35  03/02/2012   NA 141 03/02/2012   K 3.7 03/02/2012   CL 104 03/02/2012   CREATININE 0.40* 03/02/2012   BUN 8 03/02/2012   CO2 30 03/02/2012   TSH 1.272 03/02/2012   INR 1.08 03/02/2012   HGBA1C 5.9* 03/02/2012      Assessment / Plan:     Patient will be prepared for urgent surgical coronary revascularization in the morning. I discussed the details of the procedure with the patient and her family (son). She understands the so she risk of bleeding, stroke, MI, infection, and death. She agrees to multivessel bypass grafting in the morning.       @me1 @ 03/02/2012 9:07 PM

## 2012-03-02 NOTE — Progress Notes (Signed)
Pt was sent to cath lab per MD order. Pt was prepped and 2nd IV inserted. Heparin was turned off. RN went down with Pt to cath lab. Family fully informed.

## 2012-03-02 NOTE — Progress Notes (Signed)
Utilization review completed.  

## 2012-03-02 NOTE — Op Note (Signed)
Victoria Bell is a 51 y.o. female    [id number] LOCATION:  FACILITY: [facility]  PHYSICIAN: Nanetta Batty, M.D. 07-17-1961   DATE OF PROCEDURE:  03/02/2012  DATE OF DISCHARGE:  [facility]  CARDIAC CATHETERIZATION     History obtained from chart review. This is a 51 y.o. female with a past medical history significant for a history of cath/ ? Stent at [facility] in 2001, no details available. She is unemployed and her fiance that she lived with died of liver cancer in 19-May-2011. She has no insurance and has not taken any medications except antianxiety Rx for years. She smokes 2ppd. Fortunately she has not had any further cardiac problems till recently. Yesterday she went to the hospital to visit her mother when an RN on the floor became concerned because of [name] wheezing. She was evaluated in the ER and her Troponin was 0.83, she had no acute changes on her EKG. She does admit to a recent history of exertional back back which is similar to her pre-PCI symptoms in 2001. Apparently no beds at [facility], or [facility] and the patient was transferred here from [facility]. She is currently breathing well and her second Troponin is 0.43. She presents now for cardiac catheterization to define her anatomy    PROCEDURE DESCRIPTION:    The patient was brought to the second floor  [facility] lab in the postabsorptive state. She was  premedicated with Valium 5 mg by mouth, IV Versed and fentanyl. Her right wrist was prepped and shaved in usual sterile fashion. Xylocaine 1% was used  for local anesthesia. A 5 French sheath was inserted into the right radial  artery using standard Seldinger technique. The patient received  4000 units  of heparin  intravenously.  5 French right and left Judkins diagnostic catheters all with a 5 French pigtail catheter were used for selective for angiography and left ventriculography respectively. The patient does use for  the entirety of the case. A retrograde aortic, ventricular and pullback pressures were recorded.    HEMODYNAMICS:    AO SYSTOLIC/AO DIASTOLIC: 97/61   LV SYSTOLIC/LV DIASTOLIC: 100/20  ANGIOGRAPHIC RESULTS:   1. Left main; 95% eccentric distal just prior to the bifurcation  2. LAD; normal 3. Left circumflex; normal nondominant.  4. Right coronary artery; normal and dominant 5. Left ventriculography; RAO left ventriculogram was performed using  25 mL of Visipaque dye at 12 mL/second. The overall LVEF estimated  60 %  Without wall motion abnormalities  IMPRESSION: 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 She has acute coronary syndrome with non-ST segment elevation myocardial infarction.. She did receive one dose of Plavix 75 mg by mouth. She was cathed  Radially. I will restart heparin per pharmacy in 4 hours. TCTS has been consulted. She left the Cath Lab in stable condition.  Runell Gess MD, [facility] 03/02/2012 2:51 PM

## 2012-03-02 NOTE — H&P (Signed)
    Pt was reexamined and existing H & P reviewed. No changes found.  Runell Gess, MD [facility] 03/02/2012 1:54 PM

## 2012-03-02 NOTE — H&P (Signed)
Victoria Bell is an 51 y.o. female.   Chief Complaint: Shortness of breath and chest pain HPI: This is a 51 year old female transplant from [facility] with shortness of breath persistent angina with exertion and elevated troponins. Patient had history of coronary artery disease status post MI about 12 years ago. She was seen at [facility] apparently. The last 2 months she has noticed gradual exertional dyspnea. Associated with angina. Symptoms where relieved with rest. She has not been following with her cardiologist or any other medical physician due to lack of insurance. Today she went to visit her mother in the hospital and a nurse noticed that she was wheezing. She had had checked and was given breathing treatment, aspirin and evaluated in the hospital. Patient was found to have elevated troponins and was transferred over here for cardiology follow up. He tried to get her to a closee hospital to [place] but apparently they had no bed. Patient is currently chest pain-free and she is no longer wheezing after the treatment she received however she is very anxious. She denied any prior diagnosis of COPD but she smokes one to 2 packs of cigarettes per day for so many years. She has extensive family history of coronary artery disease also.  Past Medical History  Diagnosis Date  . Hypertension   . Anxiety   . GERD (gastroesophageal reflux disease)   . Myocardial infarction     2001  . Diabetes mellitus     type 2    Past Surgical History  Procedure Date  . Cardiac catheterization     2001    History reviewed. No pertinent family history. Social History:  does not have a smoking history on file. She does not have any smokeless tobacco history on file. Her alcohol and drug histories not on file.  Allergies: No Known Allergies  No prescriptions prior to admission    No results found for this or any previous visit (from the past 48 hour(s)). No results found.  Review of  Systems  Constitutional: Positive for malaise/fatigue.  HENT: Negative.   Eyes: Negative.   Respiratory: Positive for cough, shortness of breath and wheezing. Negative for hemoptysis and sputum production.   Cardiovascular: Positive for chest pain, orthopnea and PND.  Gastrointestinal: Positive for heartburn. Negative for nausea, vomiting, abdominal pain, diarrhea, constipation, blood in stool and melena.  Genitourinary: Negative.   Musculoskeletal: Negative.   Skin: Negative.   Neurological: Negative.   Endo/Heme/Allergies: Negative.   Psychiatric/Behavioral: Negative.     Blood pressure 135/70, pulse 80, temperature 97.8 F (36.6 C), temperature source Oral, resp. rate 22, height 5\' 1"  (1.549 m), weight 88.2 kg (194 lb 7.1 oz), SpO2 97.00%. Physical Exam  Constitutional: She is oriented to person, place, and time. She appears well-developed and well-nourished.  HENT:  Head: Normocephalic and atraumatic.  Right Ear: External ear normal.  Left Ear: External ear normal.  Nose: Nose normal.  Mouth/Throat: Oropharynx is clear and moist.  Eyes: Conjunctivae and EOM are normal. Pupils are equal, round, and reactive to light.  Neck: Normal range of motion. Neck supple.  Cardiovascular: Normal rate, regular rhythm, normal heart sounds and intact distal pulses.   Respiratory: Effort normal. No respiratory distress. She has wheezes. She has rales. She exhibits no tenderness.  GI: Soft. Bowel sounds are normal.  Musculoskeletal: Normal range of motion.  Neurological: She is alert and oriented to person, place, and time. She has normal reflexes.  Skin: Skin is warm and dry.  Psychiatric: She has a normal mood and affect. Her behavior is normal. Judgment and thought content normal.     Assessment/Plan Assessment this is a 51 year old female with strong family history for coronary artery disease where both her parents had coronary artery disease before they went 9 as she herself has had one  MI at age of 10, here with exertional dyspnea exertional angina now at rest as well as elevated troponin. Patient is also having what appears to be COPD exacerbation with a pulse of shortness of breath and wheezing. No evidence of CHF at this point. #1 exertional dyspnea: Patient is being admitted and will be treated as if this is a angina equivalent as well as COPD exacerbation. We will use nebulizers, oxygen, IV steroids, and consider antibiotics. We will check serial enzymes and follow her O2 sats closely. Her oxygen sat drop slightly into the 80s earlier today but she was not go home oxygen #2 chest pain: The discussion with chest pain since more to do with GERD today however she has been having exertional chest pain that sounds like angina for the past 2 months. We will therefore admit her to telemetry, check serial cardiac enzymes, nitroglycerin patch and cardiology consult due to her significant risk factors and history. #3 diabetes: We will check hemoglobin A1c, CBGs q. a.c. and each bedtime, and put her on sliding scale insulin. #4 hypertension: Patient will be on beta blocker and possibly ACE inhibitor. Blood pressure at the moment seems well controlled. #5 GERD: We will keep him PPIs probably twice a day #6 anxiety disorder: All put her on probably benzodiazepine. Patient counseled also provided to reassure her. #7 prophylaxis: Patient will be placed on Lovenox 40 subcutaneously for DVT prophylaxis.  Victoria Bell,Victoria Bell 03/02/2012, 3:34 AM

## 2012-03-02 NOTE — Progress Notes (Signed)
CRITICAL VALUE ALERT  Critical value received:  Troponin - 0.48  Date of notification:  03/02/12  Time of notification:  0510  Critical value read back: yes  Nurse who received alert:  Elisha Headland RN   MD notified (1st page):  Zachary George NP   Time of first page:  0515  Responding MD:  Zachary George NP  Time MD responded:  6962  I: No new orders written. Pt previous troponin prior to admission to [id number] was 0.83. See pt chart for more information.  Elisha Headland RN

## 2012-03-03 ENCOUNTER — Encounter (HOSPITAL_COMMUNITY)
Admission: AD | Disposition: A | Discharge status: Home / Self Care | Payer: Self-pay | Source: Other Acute Inpatient Hospital | Attending: Cardiothoracic Surgery

## 2012-03-03 ENCOUNTER — Encounter (HOSPITAL_COMMUNITY): Payer: Self-pay | Admitting: Anesthesiology

## 2012-03-03 ENCOUNTER — Inpatient Hospital Stay (HOSPITAL_COMMUNITY): Payer: Medicaid Other

## 2012-03-03 ENCOUNTER — Inpatient Hospital Stay (HOSPITAL_COMMUNITY): Payer: Medicaid Other | Admitting: Anesthesiology

## 2012-03-03 DIAGNOSIS — I2510 Atherosclerotic heart disease of native coronary artery without angina pectoris: Secondary | ICD-10-CM

## 2012-03-03 HISTORY — PX: CORONARY ARTERY BYPASS GRAFT: SHX141

## 2012-03-03 LAB — URINALYSIS, ROUTINE W REFLEX MICROSCOPIC
Bilirubin Urine: NEGATIVE
Glucose, UA: NEGATIVE mg/dL
Hgb urine dipstick: NEGATIVE
Ketones, ur: NEGATIVE mg/dL
Leukocytes, UA: NEGATIVE
Nitrite: NEGATIVE
Protein, ur: NEGATIVE mg/dL
Specific Gravity, Urine: 1.009 (ref 1.005–1.030)
Urobilinogen, UA: 1 mg/dL (ref 0.0–1.0)
pH: 6.5 (ref 5.0–8.0)

## 2012-03-03 LAB — POCT I-STAT 4, (NA,K, GLUC, HGB,HCT)
Glucose, Bld: 118 mg/dL — ABNORMAL HIGH (ref 70–99)
Glucose, Bld: 204 mg/dL — ABNORMAL HIGH (ref 70–99)
Glucose, Bld: 111 mg/dL — ABNORMAL HIGH (ref 70–99)
HCT: 36 % (ref 36.0–46.0)
HCT: 27 % — ABNORMAL LOW (ref 36.0–46.0)
HCT: 30 % — ABNORMAL LOW (ref 36.0–46.0)
Hemoglobin: 12.2 g/dL (ref 12.0–15.0)
Hemoglobin: 10.2 g/dL — ABNORMAL LOW (ref 12.0–15.0)
Potassium: 3.7 mEq/L (ref 3.5–5.1)
Potassium: 3.9 mEq/L (ref 3.5–5.1)
Sodium: 142 mEq/L (ref 135–145)
Sodium: 136 mEq/L (ref 135–145)

## 2012-03-03 LAB — CBC
HCT: 43.2 % (ref 36.0–46.0)
HCT: 28.7 % — ABNORMAL LOW (ref 36.0–46.0)
HCT: 28.5 % — ABNORMAL LOW (ref 36.0–46.0)
Hemoglobin: 13.9 g/dL (ref 12.0–15.0)
Hemoglobin: 8.9 g/dL — ABNORMAL LOW (ref 12.0–15.0)
MCH: 29.9 pg (ref 26.0–34.0)
MCH: 29.4 pg (ref 26.0–34.0)
MCH: 29.4 pg (ref 26.0–34.0)
MCHC: 32.2 g/dL (ref 30.0–36.0)
MCHC: 31.2 g/dL (ref 30.0–36.0)
MCV: 92.7 fL (ref 78.0–100.0)
MCV: 92.9 fL (ref 78.0–100.0)
MCV: 92.6 fL (ref 78.0–100.0)
MCV: 94.1 fL (ref 78.0–100.0)
Platelets: 134 10*3/uL — ABNORMAL LOW (ref 150–400)
Platelets: 132 10*3/uL — ABNORMAL LOW (ref 150–400)
Platelets: 123 10*3/uL — ABNORMAL LOW (ref 150–400)
Platelets: 122 10*3/uL — ABNORMAL LOW (ref 150–400)
RBC: 4.53 MIL/uL (ref 3.87–5.11)
RBC: 4.65 MIL/uL (ref 3.87–5.11)
RBC: 3.03 MIL/uL — ABNORMAL LOW (ref 3.87–5.11)
RDW: 14.4 % (ref 11.5–15.5)
RDW: 14.6 % (ref 11.5–15.5)
RDW: 14.7 % (ref 11.5–15.5)
WBC: 10.9 10*3/uL — ABNORMAL HIGH (ref 4.0–10.5)
WBC: 12.1 10*3/uL — ABNORMAL HIGH (ref 4.0–10.5)
WBC: 13.9 10*3/uL — ABNORMAL HIGH (ref 4.0–10.5)

## 2012-03-03 LAB — APTT: aPTT: 126 seconds — ABNORMAL HIGH (ref 24–37)

## 2012-03-03 LAB — BLOOD GAS, ARTERIAL
Acid-Base Excess: 4 mmol/L — ABNORMAL HIGH (ref 0.0–2.0)
Bicarbonate: 29.3 mEq/L — ABNORMAL HIGH (ref 20.0–24.0)
FIO2: 0.24 %
O2 Saturation: 97.1 %
Patient temperature: 98.6
TCO2: 31 mmol/L (ref 0–100)
pCO2 arterial: 55.1 mmHg — ABNORMAL HIGH (ref 35.0–45.0)
pH, Arterial: 7.345 — ABNORMAL LOW (ref 7.350–7.400)
pO2, Arterial: 96.4 mmHg (ref 80.0–100.0)

## 2012-03-03 LAB — CREATININE, SERUM
Creatinine, Ser: 0.46 mg/dL — ABNORMAL LOW (ref 0.50–1.10)
GFR calc Af Amer: >90 mL/min (ref >90)
GFR calc non Af Amer: >90 mL/min (ref >90)

## 2012-03-03 LAB — BASIC METABOLIC PANEL
BUN: 9 mg/dL (ref 6–23)
CO2: 27 mEq/L (ref 19–32)
Calcium: 9.3 mg/dL (ref 8.4–10.5)
Chloride: 104 mEq/L (ref 96–112)
Creatinine, Ser: 0.45 mg/dL — ABNORMAL LOW (ref 0.50–1.10)
GFR calc Af Amer: >90 mL/min (ref >90)
GFR calc non Af Amer: >90 mL/min (ref >90)
Glucose, Bld: 107 mg/dL — ABNORMAL HIGH (ref 70–99)
Potassium: 3.7 mEq/L (ref 3.5–5.1)
Sodium: 142 mEq/L (ref 135–145)

## 2012-03-03 LAB — PLATELET COUNT: Platelets: 170 10*3/uL (ref 150–400)

## 2012-03-03 LAB — POCT I-STAT 3, ART BLOOD GAS (G3+)
Acid-Base Excess: 3 mmol/L — ABNORMAL HIGH (ref 0.0–2.0)
Acid-Base Excess: 1 mmol/L (ref 0.0–2.0)
Bicarbonate: 29 mEq/L — ABNORMAL HIGH (ref 20.0–24.0)
Bicarbonate: 29.4 mEq/L — ABNORMAL HIGH (ref 20.0–24.0)
Bicarbonate: 27.1 mEq/L — ABNORMAL HIGH (ref 20.0–24.0)
O2 Saturation: 92 %
O2 Saturation: 95 %
Patient temperature: 36.8
Patient temperature: 37.3
Patient temperature: 37.5
TCO2: 31 mmol/L (ref 0–100)
TCO2: 27 mmol/L (ref 0–100)
TCO2: 29 mmol/L (ref 0–100)
TCO2: 31 mmol/L (ref 0–100)
pCO2 arterial: 58.4 mmHg (ref 35.0–45.0)
pH, Arterial: 7.351 (ref 7.350–7.400)
pH, Arterial: 7.303 — ABNORMAL LOW (ref 7.350–7.400)
pO2, Arterial: 73 mmHg — ABNORMAL LOW (ref 80.0–100.0)
pO2, Arterial: 85 mmHg (ref 80.0–100.0)

## 2012-03-03 LAB — HEMOGLOBIN AND HEMATOCRIT, BLOOD
HCT: 25.8 % — ABNORMAL LOW (ref 36.0–46.0)
Hemoglobin: 8.5 g/dL — ABNORMAL LOW (ref 12.0–15.0)

## 2012-03-03 LAB — MAGNESIUM: Magnesium: 3.2 mg/dL — ABNORMAL HIGH (ref 1.5–2.5)

## 2012-03-03 LAB — PREPARE RBC (CROSSMATCH)

## 2012-03-03 LAB — ABO/RH: ABO/RH(D): A POS

## 2012-03-03 LAB — GLUCOSE, CAPILLARY: Glucose-Capillary: 165 mg/dL — ABNORMAL HIGH (ref 70–99)

## 2012-03-03 SURGERY — CORONARY ARTERY BYPASS GRAFTING (CABG)
Anesthesia: General | Site: Chest | Wound class: Clean

## 2012-03-03 MED ORDER — SODIUM CHLORIDE 0.45 % IV SOLN
INTRAVENOUS | Status: DC
  Administered 2012-03-03 – 2012-03-05 (×2): 20 mL/h via INTRAVENOUS
  Administered 2012-03-07: 18:00:00 via INTRAVENOUS

## 2012-03-03 MED ORDER — POTASSIUM CHLORIDE 10 MEQ/50ML IV SOLN
10.0000 meq | Freq: Once | INTRAVENOUS | Status: AC
  Administered 2012-03-03: 10 meq via INTRAVENOUS

## 2012-03-03 MED ORDER — ASPIRIN 81 MG PO CHEW: 324.0000 mg | CHEWABLE_TABLET | Freq: Every day | ORAL | Status: DC

## 2012-03-03 MED ORDER — METOPROLOL TARTRATE 1 MG/ML IV SOLN
2.5000 mg | INTRAVENOUS | Status: DC | PRN
  Administered 2012-03-07 – 2012-03-09 (×2): 5 mg via INTRAVENOUS
  Filled 2012-03-03 (×2): qty 5

## 2012-03-03 MED ORDER — MORPHINE SULFATE 4 MG/ML IJ SOLN: 0.0500 mg/kg | INTRAMUSCULAR | Status: DC | PRN

## 2012-03-03 MED ORDER — MORPHINE SULFATE 2 MG/ML IJ SOLN
2.0000 mg | INTRAMUSCULAR | Status: DC | PRN
  Administered 2012-03-04: 2 mg via INTRAVENOUS
  Filled 2012-03-03 (×2): qty 1

## 2012-03-03 MED ORDER — SODIUM CHLORIDE 0.9 % IJ SOLN: 3.0000 mL | INTRAMUSCULAR | Status: DC | PRN

## 2012-03-03 MED ORDER — INSULIN REGULAR BOLUS VIA INFUSION
0.0000 [IU] | Freq: Three times a day (TID) | INTRAVENOUS | Status: DC
  Filled 2012-03-03: qty 10

## 2012-03-03 MED ORDER — HYDROMORPHONE HCL PF 1 MG/ML IJ SOLN: 0.2500 mg | INTRAMUSCULAR | Status: DC | PRN

## 2012-03-03 MED ORDER — LEVALBUTEROL HCL 0.63 MG/3ML IN NEBU
0.6300 mg | INHALATION_SOLUTION | Freq: Three times a day (TID) | RESPIRATORY_TRACT | Status: DC
  Administered 2012-03-03 – 2012-03-15 (×34): 0.63 mg via RESPIRATORY_TRACT
  Filled 2012-03-03 (×37): qty 3

## 2012-03-03 MED ORDER — FAMOTIDINE IN NACL 20-0.9 MG/50ML-% IV SOLN
20.0000 mg | Freq: Two times a day (BID) | INTRAVENOUS | Status: AC
  Administered 2012-03-03 (×2): 20 mg via INTRAVENOUS
  Filled 2012-03-03: qty 50

## 2012-03-03 MED ORDER — SIMVASTATIN 20 MG PO TABS
20.0000 mg | ORAL_TABLET | Freq: Every day | ORAL | Status: DC
  Administered 2012-03-04 – 2012-03-14 (×11): 20 mg via ORAL
  Filled 2012-03-03 (×13): qty 1

## 2012-03-03 MED ORDER — DOPAMINE-DEXTROSE 3.2-5 MG/ML-% IV SOLN: 0.0000 ug/kg/min | INTRAVENOUS | Status: DC

## 2012-03-03 MED ORDER — ACETAMINOPHEN 160 MG/5ML PO SOLN: 975.0000 mg | Freq: Four times a day (QID) | ORAL | Status: DC

## 2012-03-03 MED ORDER — VANCOMYCIN HCL IN DEXTROSE 1-5 GM/200ML-% IV SOLN
1000.0000 mg | Freq: Once | INTRAVENOUS | Status: AC
  Administered 2012-03-03: 1000 mg via INTRAVENOUS
  Filled 2012-03-03: qty 200

## 2012-03-03 MED ORDER — LACTATED RINGERS IV SOLN: 500.0000 mL | Freq: Once | INTRAVENOUS | Status: AC | PRN

## 2012-03-03 MED ORDER — METOPROLOL TARTRATE 12.5 MG HALF TABLET
12.5000 mg | ORAL_TABLET | Freq: Two times a day (BID) | ORAL | Status: DC
  Administered 2012-03-07 (×2): 12.5 mg via ORAL
  Filled 2012-03-03 (×11): qty 1

## 2012-03-03 MED ORDER — PANTOPRAZOLE SODIUM 40 MG PO TBEC
40.0000 mg | DELAYED_RELEASE_TABLET | Freq: Every day | ORAL | Status: DC
  Administered 2012-03-05 – 2012-03-14 (×9): 40 mg via ORAL
  Filled 2012-03-03 (×9): qty 1

## 2012-03-03 MED ORDER — PROPOFOL 10 MG/ML IV EMUL
INTRAVENOUS | Status: DC | PRN
  Administered 2012-03-03: 130 mg via INTRAVENOUS

## 2012-03-03 MED ORDER — ASPIRIN EC 325 MG PO TBEC
325.0000 mg | DELAYED_RELEASE_TABLET | Freq: Every day | ORAL | Status: DC
  Administered 2012-03-04 – 2012-03-14 (×11): 325 mg via ORAL
  Filled 2012-03-03 (×12): qty 1

## 2012-03-03 MED ORDER — ACETAMINOPHEN 160 MG/5ML PO SOLN: 650.0000 mg | ORAL | Status: AC

## 2012-03-03 MED ORDER — MORPHINE SULFATE 2 MG/ML IJ SOLN
1.0000 mg | INTRAMUSCULAR | Status: AC | PRN
  Administered 2012-03-03: 2 mg via INTRAVENOUS

## 2012-03-03 MED ORDER — SUCCINYLCHOLINE CHLORIDE 20 MG/ML IJ SOLN
INTRAMUSCULAR | Status: DC | PRN
  Administered 2012-03-03: 50 mg via INTRAVENOUS
  Administered 2012-03-03: 100 mg via INTRAVENOUS

## 2012-03-03 MED ORDER — HEMOSTATIC AGENTS (NO CHARGE) OPTIME
TOPICAL | Status: DC | PRN
  Administered 2012-03-03: 1 via TOPICAL

## 2012-03-03 MED ORDER — MIDAZOLAM HCL 2 MG/2ML IJ SOLN
2.0000 mg | INTRAMUSCULAR | Status: DC | PRN
  Administered 2012-03-04: 2 mg via INTRAVENOUS
  Filled 2012-03-03: qty 2

## 2012-03-03 MED ORDER — ACETAMINOPHEN 500 MG PO TABS: 1000.0000 mg | ORAL_TABLET | Freq: Four times a day (QID) | ORAL | Status: DC

## 2012-03-03 MED ORDER — BISACODYL 10 MG RE SUPP: 10.0000 mg | Freq: Every day | RECTAL | Status: DC

## 2012-03-03 MED ORDER — POTASSIUM CHLORIDE 10 MEQ/50ML IV SOLN
10.0000 meq | INTRAVENOUS | Status: AC
  Administered 2012-03-03 (×3): 10 meq via INTRAVENOUS

## 2012-03-03 MED ORDER — FENTANYL CITRATE 0.05 MG/ML IJ SOLN
INTRAMUSCULAR | Status: DC | PRN
  Administered 2012-03-03: 150 ug via INTRAVENOUS
  Administered 2012-03-03 (×2): 250 ug via INTRAVENOUS
  Administered 2012-03-03: 100 ug via INTRAVENOUS
  Administered 2012-03-03 (×2): 50 ug via INTRAVENOUS
  Administered 2012-03-03: 250 ug via INTRAVENOUS

## 2012-03-03 MED ORDER — ALBUMIN HUMAN 5 % IV SOLN
250.0000 mL | INTRAVENOUS | Status: AC | PRN
  Administered 2012-03-03 – 2012-03-04 (×3): 250 mL via INTRAVENOUS
  Filled 2012-03-03: qty 250

## 2012-03-03 MED ORDER — TRANEXAMIC ACID 100 MG/ML IV SOLN
1.5000 mg/kg/h | INTRAVENOUS | Status: DC
  Filled 2012-03-03: qty 25

## 2012-03-03 MED ORDER — SODIUM CHLORIDE 0.9 % IV SOLN
INTRAVENOUS | Status: DC
  Administered 2012-03-03: 20 mL/h via INTRAVENOUS

## 2012-03-03 MED ORDER — SODIUM BICARBONATE 8.4 % IV SOLN
INTRAVENOUS | Status: AC
  Administered 2012-03-03: 12:00:00
  Filled 2012-03-03 (×2): qty 2.5

## 2012-03-03 MED ORDER — ACETAMINOPHEN 500 MG PO TABS
1000.0000 mg | ORAL_TABLET | Freq: Four times a day (QID) | ORAL | Status: DC
  Administered 2012-03-04 – 2012-03-08 (×16): 1000 mg via ORAL
  Filled 2012-03-03 (×19): qty 2

## 2012-03-03 MED ORDER — HEMOSTATIC AGENTS (NO CHARGE) OPTIME
TOPICAL | Status: DC | PRN
  Administered 2012-03-03: 3 via TOPICAL

## 2012-03-03 MED ORDER — OXYCODONE HCL 5 MG PO TABS
5.0000 mg | ORAL_TABLET | ORAL | Status: DC | PRN
  Administered 2012-03-04 – 2012-03-05 (×2): 5 mg via ORAL
  Administered 2012-03-08: 10 mg via ORAL
  Administered 2012-03-09 – 2012-03-12 (×4): 5 mg via ORAL
  Filled 2012-03-03: qty 2
  Filled 2012-03-03: qty 1
  Filled 2012-03-03: qty 2
  Filled 2012-03-03 (×4): qty 1

## 2012-03-03 MED ORDER — BISACODYL 5 MG PO TBEC
10.0000 mg | DELAYED_RELEASE_TABLET | Freq: Every day | ORAL | Status: DC
  Administered 2012-03-04 – 2012-03-15 (×7): 10 mg via ORAL
  Filled 2012-03-03 (×8): qty 2

## 2012-03-03 MED ORDER — PROTAMINE SULFATE 10 MG/ML IV SOLN
INTRAVENOUS | Status: DC | PRN
  Administered 2012-03-03: 250 mg via INTRAVENOUS

## 2012-03-03 MED ORDER — METOPROLOL TARTRATE 25 MG/10 ML ORAL SUSPENSION
12.5000 mg | Freq: Two times a day (BID) | ORAL | Status: DC
  Filled 2012-03-03: qty 5

## 2012-03-03 MED ORDER — GUAIFENESIN ER 600 MG PO TB12
600.0000 mg | ORAL_TABLET | Freq: Two times a day (BID) | ORAL | Status: DC | PRN
  Administered 2012-03-04: 600 mg via ORAL
  Filled 2012-03-03: qty 1

## 2012-03-03 MED ORDER — METOPROLOL TARTRATE 12.5 MG HALF TABLET
12.5000 mg | ORAL_TABLET | Freq: Two times a day (BID) | ORAL | Status: DC
  Filled 2012-03-03: qty 1

## 2012-03-03 MED ORDER — ACETAMINOPHEN 650 MG RE SUPP
650.0000 mg | RECTAL | Status: AC
  Administered 2012-03-03: 650 mg via RECTAL

## 2012-03-03 MED ORDER — PHENYLEPHRINE HCL 10 MG/ML IJ SOLN
0.0000 ug/min | INTRAVENOUS | Status: DC
  Administered 2012-03-04: 30 ug/min via INTRAVENOUS
  Filled 2012-03-03 (×3): qty 2

## 2012-03-03 MED ORDER — PHENYLEPHRINE HCL 10 MG/ML IJ SOLN
20.0000 mg | INTRAVENOUS | Status: DC | PRN
  Administered 2012-03-03: 10 ug/min via INTRAVENOUS

## 2012-03-03 MED ORDER — LACTATED RINGERS IV SOLN
INTRAVENOUS | Status: DC | PRN
  Administered 2012-03-03: 07:00:00 via INTRAVENOUS

## 2012-03-03 MED ORDER — 0.9 % SODIUM CHLORIDE (POUR BTL) OPTIME
TOPICAL | Status: DC | PRN
  Administered 2012-03-03: 7000 mL

## 2012-03-03 MED ORDER — VECURONIUM BROMIDE 10 MG IV SOLR
INTRAVENOUS | Status: DC | PRN
  Administered 2012-03-03 (×3): 10 mg via INTRAVENOUS

## 2012-03-03 MED ORDER — HEPARIN SODIUM (PORCINE) 1000 UNIT/ML IJ SOLN
INTRAMUSCULAR | Status: DC | PRN
  Administered 2012-03-03: 27000 [IU] via INTRAVENOUS
  Administered 2012-03-03: 5000 [IU] via INTRAVENOUS

## 2012-03-03 MED ORDER — ACETAMINOPHEN 160 MG/5ML PO SOLN
975.0000 mg | Freq: Four times a day (QID) | ORAL | Status: DC
  Administered 2012-03-03 – 2012-03-04 (×2): 975 mg
  Filled 2012-03-03 (×2): qty 40.6

## 2012-03-03 MED ORDER — NITROGLYCERIN IN D5W 200-5 MCG/ML-% IV SOLN: 0.0000 ug/min | INTRAVENOUS | Status: DC

## 2012-03-03 MED ORDER — DEXTROSE 5 % IV SOLN
1.5000 g | Freq: Two times a day (BID) | INTRAVENOUS | Status: AC
  Administered 2012-03-03 – 2012-03-05 (×4): 1.5 g via INTRAVENOUS
  Filled 2012-03-03 (×4): qty 1.5

## 2012-03-03 MED ORDER — ALPRAZOLAM 0.5 MG PO TABS
1.0000 mg | ORAL_TABLET | Freq: Two times a day (BID) | ORAL | Status: DC
  Administered 2012-03-04: 1 mg via ORAL
  Filled 2012-03-03: qty 1

## 2012-03-03 MED ORDER — SODIUM CHLORIDE 0.9 % IV SOLN
0.1000 ug/kg/h | INTRAVENOUS | Status: DC
  Filled 2012-03-03: qty 2

## 2012-03-03 MED ORDER — INSULIN ASPART 100 UNIT/ML ~~LOC~~ SOLN
0.0000 [IU] | SUBCUTANEOUS | Status: DC
  Administered 2012-03-03 – 2012-03-04 (×3): 2 [IU] via SUBCUTANEOUS

## 2012-03-03 MED ORDER — ONDANSETRON HCL 4 MG/2ML IJ SOLN
4.0000 mg | Freq: Four times a day (QID) | INTRAMUSCULAR | Status: DC | PRN
  Administered 2012-03-04 – 2012-03-13 (×8): 4 mg via INTRAVENOUS
  Filled 2012-03-03 (×8): qty 2

## 2012-03-03 MED ORDER — MIDAZOLAM HCL 5 MG/5ML IJ SOLN
INTRAMUSCULAR | Status: DC | PRN
  Administered 2012-03-03: 4 mg via INTRAVENOUS
  Administered 2012-03-03: 7 mg via INTRAVENOUS
  Administered 2012-03-03: 8 mg via INTRAVENOUS
  Administered 2012-03-03: 1 mg via INTRAVENOUS

## 2012-03-03 MED ORDER — BUDESONIDE 0.5 MG/2ML IN SUSP: 0.5000 mg | Freq: Two times a day (BID) | RESPIRATORY_TRACT | Status: DC

## 2012-03-03 MED ORDER — SODIUM CHLORIDE 0.9 % IV SOLN
INTRAVENOUS | Status: DC
  Filled 2012-03-03: qty 1

## 2012-03-03 MED ORDER — MAGNESIUM SULFATE 40 MG/ML IJ SOLN
4.0000 g | Freq: Once | INTRAMUSCULAR | Status: AC
  Administered 2012-03-03: 4 g via INTRAVENOUS
  Filled 2012-03-03: qty 100

## 2012-03-03 MED ORDER — ALBUMIN HUMAN 5 % IV SOLN
INTRAVENOUS | Status: DC | PRN
  Administered 2012-03-03: 13:00:00 via INTRAVENOUS

## 2012-03-03 MED ORDER — DOCUSATE SODIUM 100 MG PO CAPS
200.0000 mg | ORAL_CAPSULE | Freq: Every day | ORAL | Status: DC
  Administered 2012-03-04 – 2012-03-15 (×9): 200 mg via ORAL
  Filled 2012-03-03 (×10): qty 2

## 2012-03-03 MED ORDER — METOPROLOL TARTRATE 25 MG/10 ML ORAL SUSPENSION
12.5000 mg | Freq: Two times a day (BID) | ORAL | Status: DC
  Filled 2012-03-03 (×11): qty 5

## 2012-03-03 MED ORDER — ROCURONIUM BROMIDE 100 MG/10ML IV SOLN
INTRAVENOUS | Status: DC | PRN
  Administered 2012-03-03 (×4): 50 mg via INTRAVENOUS

## 2012-03-03 MED ORDER — SODIUM CHLORIDE 0.9 % IV SOLN: 250.0000 mL | INTRAVENOUS | Status: DC

## 2012-03-03 MED ORDER — SODIUM CHLORIDE 0.9 % IV SOLN
INTRAVENOUS | Status: DC | PRN
  Administered 2012-03-03: 14:00:00 via INTRAVENOUS

## 2012-03-03 MED ORDER — SODIUM CHLORIDE 0.9 % IJ SOLN
3.0000 mL | Freq: Two times a day (BID) | INTRAMUSCULAR | Status: DC
  Administered 2012-03-04 – 2012-03-05 (×4): 3 mL via INTRAVENOUS
  Administered 2012-03-06: 10:00:00 via INTRAVENOUS
  Administered 2012-03-07: 3 mL via INTRAVENOUS

## 2012-03-03 MED ORDER — LACTATED RINGERS IV SOLN
INTRAVENOUS | Status: DC
  Administered 2012-03-03: 20 mL/h via INTRAVENOUS

## 2012-03-03 MED ORDER — INSULIN ASPART 100 UNIT/ML ~~LOC~~ SOLN
0.0000 [IU] | SUBCUTANEOUS | Status: AC
  Administered 2012-03-03: 2 [IU] via SUBCUTANEOUS
  Administered 2012-03-03: 4 [IU] via SUBCUTANEOUS

## 2012-03-03 MED ORDER — SODIUM CHLORIDE 0.9 % IV SOLN
0.1000 ug/kg/h | INTRAVENOUS | Status: DC
  Filled 2012-03-03: qty 4

## 2012-03-03 SURGICAL SUPPLY — 131 items
ADAPTER CARDIO PERF ANTE/RETRO (ADAPTER) ×3 IMPLANT
APPLIER CLIP 9.375 MED OPEN (MISCELLANEOUS)
APPLIER CLIP 9.375 SM OPEN (CLIP)
ATTRACTOMAT 16X20 MAGNETIC DRP (DRAPES) ×3 IMPLANT
BAG DECANTER FOR FLEXI CONT (MISCELLANEOUS) ×3 IMPLANT
BANDAGE ELASTIC 3 VELCRO ST LF (GAUZE/BANDAGES/DRESSINGS) ×3 IMPLANT
BANDAGE ELASTIC 4 VELCRO ST LF (GAUZE/BANDAGES/DRESSINGS) ×3 IMPLANT
BANDAGE ELASTIC 6 VELCRO ST LF (GAUZE/BANDAGES/DRESSINGS) ×3 IMPLANT
BANDAGE GAUZE ELAST BULKY 4 IN (GAUZE/BANDAGES/DRESSINGS) ×3 IMPLANT
BASKET HEART  (ORDER IN 25'S) (MISCELLANEOUS) ×1
BASKET HEART (ORDER IN 25'S) (MISCELLANEOUS) ×1
BASKET HEART (ORDER IN 25S) (MISCELLANEOUS) ×1 IMPLANT
BLADE STERNUM SYSTEM 6 (BLADE) ×3 IMPLANT
BLADE SURG 12 STRL SS (BLADE) ×3 IMPLANT
BLADE SURG ROTATE 9660 (MISCELLANEOUS) ×3 IMPLANT
CANISTER SUCTION 2500CC (MISCELLANEOUS) ×3 IMPLANT
CANNULA GUNDRY RCSP 15FR (MISCELLANEOUS) ×3 IMPLANT
CANNULA VENOUS MAL SGL STG 40 (MISCELLANEOUS) IMPLANT
CANNULAE VENOUS MAL SGL STG 40 (MISCELLANEOUS)
CATH CPB KIT VANTRIGT (MISCELLANEOUS) ×3 IMPLANT
CATH ROBINSON RED A/P 18FR (CATHETERS) ×9 IMPLANT
CATH THORACIC 28FR (CATHETERS) IMPLANT
CATH THORACIC 28FR RT ANG (CATHETERS) IMPLANT
CATH THORACIC 36FR (CATHETERS) IMPLANT
CATH THORACIC 36FR RT ANG (CATHETERS) ×6 IMPLANT
CLIP APPLIE 9.375 MED OPEN (MISCELLANEOUS) IMPLANT
CLIP APPLIE 9.375 SM OPEN (CLIP) IMPLANT
CLIP FOGARTY SPRING 6M (CLIP) IMPLANT
CLIP TI MEDIUM 24 (CLIP) IMPLANT
CLIP TI WIDE RED SMALL 24 (CLIP) ×6 IMPLANT
CLOTH BEACON ORANGE TIMEOUT ST (SAFETY) ×3 IMPLANT
CONN Y 3/8X3/8X3/8  BEN (MISCELLANEOUS)
CONN Y 3/8X3/8X3/8 BEN (MISCELLANEOUS) IMPLANT
COVER SURGICAL LIGHT HANDLE (MISCELLANEOUS) ×6 IMPLANT
CRADLE DONUT ADULT HEAD (MISCELLANEOUS) ×3 IMPLANT
DERMABOND ADVANCED (GAUZE/BANDAGES/DRESSINGS) ×2
DERMABOND ADVANCED .7 DNX12 (GAUZE/BANDAGES/DRESSINGS) ×1 IMPLANT
DRAIN CHANNEL 32F RND 10.7 FF (WOUND CARE) ×3 IMPLANT
DRAPE CARDIOVASCULAR INCISE (DRAPES) ×2
DRAPE SLUSH MACHINE 52X66 (DRAPES) IMPLANT
DRAPE SLUSH/WARMER DISC (DRAPES) ×3 IMPLANT
DRAPE SRG 135X102X78XABS (DRAPES) ×1 IMPLANT
DRSG COVADERM 4X14 (GAUZE/BANDAGES/DRESSINGS) ×3 IMPLANT
ELECT BLADE 4.0 EZ CLEAN MEGAD (MISCELLANEOUS) ×9
ELECT BLADE 6.5 EXT (BLADE) ×3 IMPLANT
ELECT CAUTERY BLADE 6.4 (BLADE) ×3 IMPLANT
ELECT REM PT RETURN 9FT ADLT (ELECTROSURGICAL) ×6
ELECTRODE BLDE 4.0 EZ CLN MEGD (MISCELLANEOUS) ×3 IMPLANT
ELECTRODE REM PT RTRN 9FT ADLT (ELECTROSURGICAL) ×2 IMPLANT
GLOVE BIO SURGEON STRL SZ 6 (GLOVE) ×6 IMPLANT
GLOVE BIO SURGEON STRL SZ 6.5 (GLOVE) ×8 IMPLANT
GLOVE BIO SURGEON STRL SZ7 (GLOVE) ×6 IMPLANT
GLOVE BIO SURGEON STRL SZ7.5 (GLOVE) ×6 IMPLANT
GLOVE BIO SURGEONS STRL SZ 6.5 (GLOVE) ×4
GLOVE BIOGEL PI IND STRL 6 (GLOVE) ×2 IMPLANT
GLOVE BIOGEL PI IND STRL 6.5 (GLOVE) ×2 IMPLANT
GLOVE BIOGEL PI IND STRL 7.0 (GLOVE) IMPLANT
GLOVE BIOGEL PI INDICATOR 6 (GLOVE) ×4
GLOVE BIOGEL PI INDICATOR 6.5 (GLOVE) ×4
GLOVE BIOGEL PI INDICATOR 7.0 (GLOVE)
GLOVE EUDERMIC 7 POWDERFREE (GLOVE) IMPLANT
GLOVE ORTHO TXT STRL SZ7.5 (GLOVE) IMPLANT
GOWN STRL NON-REIN LRG LVL3 (GOWN DISPOSABLE) ×12 IMPLANT
HEMOSTAT POWDER SURGIFOAM 1G (HEMOSTASIS) ×9 IMPLANT
HEMOSTAT SURGICEL 2X14 (HEMOSTASIS) ×3 IMPLANT
INSERT FOGARTY 61MM (MISCELLANEOUS) IMPLANT
INSERT FOGARTY XLG (MISCELLANEOUS) IMPLANT
KIT BASIN OR (CUSTOM PROCEDURE TRAY) ×3 IMPLANT
KIT ROOM TURNOVER OR (KITS) ×3 IMPLANT
KIT SUCTION CATH 14FR (SUCTIONS) ×3 IMPLANT
KIT VASOVIEW ACCESSORY VH 2004 (KITS) ×3 IMPLANT
KIT VASOVIEW W/TROCAR VH 2000 (KITS) ×3 IMPLANT
LEAD PACING MYOCARDI (MISCELLANEOUS) ×3 IMPLANT
MARKER GRAFT CORONARY BYPASS (MISCELLANEOUS) ×9 IMPLANT
MATRIX HEMOSTAT SURGIFLO (HEMOSTASIS) ×3 IMPLANT
NS IRRIG 1000ML POUR BTL (IV SOLUTION) ×15 IMPLANT
PACK OPEN HEART (CUSTOM PROCEDURE TRAY) ×3 IMPLANT
PAD ARMBOARD 7.5X6 YLW CONV (MISCELLANEOUS) ×6 IMPLANT
PENCIL BUTTON HOLSTER BLD 10FT (ELECTRODE) ×9 IMPLANT
PUNCH AORTIC ROTATE 4.0MM (MISCELLANEOUS) IMPLANT
PUNCH AORTIC ROTATE 4.5MM 8IN (MISCELLANEOUS) ×3 IMPLANT
PUNCH AORTIC ROTATE 5MM 8IN (MISCELLANEOUS) IMPLANT
SOLUTION ANTI FOG 6CC (MISCELLANEOUS) IMPLANT
SPONGE GAUZE 4X4 12PLY (GAUZE/BANDAGES/DRESSINGS) ×6 IMPLANT
SPONGE INTESTINAL PEANUT (DISPOSABLE) IMPLANT
SPONGE LAP 18X18 X RAY DECT (DISPOSABLE) ×9 IMPLANT
SPONGE LAP 4X18 X RAY DECT (DISPOSABLE) ×3 IMPLANT
SUT BONE WAX W31G (SUTURE) ×3 IMPLANT
SUT ETHILON 3 0 FSL (SUTURE) ×12 IMPLANT
SUT MNCRL AB 4-0 PS2 18 (SUTURE) ×6 IMPLANT
SUT PROLENE 3 0 SH DA (SUTURE) ×3 IMPLANT
SUT PROLENE 3 0 SH1 36 (SUTURE) IMPLANT
SUT PROLENE 4 0 RB 1 (SUTURE) ×2
SUT PROLENE 4 0 SH DA (SUTURE) ×3 IMPLANT
SUT PROLENE 4-0 RB1 .5 CRCL 36 (SUTURE) ×1 IMPLANT
SUT PROLENE 5 0 C 1 36 (SUTURE) IMPLANT
SUT PROLENE 6 0 C 1 30 (SUTURE) IMPLANT
SUT PROLENE 6 0 CC (SUTURE) IMPLANT
SUT PROLENE 7 0 BV 1 (SUTURE) IMPLANT
SUT PROLENE 7 0 BV1 MDA (SUTURE) IMPLANT
SUT PROLENE 7 0 DA (SUTURE) IMPLANT
SUT PROLENE 7.0 RB 3 (SUTURE) ×9 IMPLANT
SUT PROLENE 8 0 BV175 6 (SUTURE) ×9 IMPLANT
SUT PROLENE BLUE 7 0 (SUTURE) ×12 IMPLANT
SUT PROLENE POLY MONO (SUTURE) IMPLANT
SUT SILK  1 MH (SUTURE)
SUT SILK 1 MH (SUTURE) IMPLANT
SUT SILK 2 0 SH CR/8 (SUTURE) ×3 IMPLANT
SUT SILK 3 0 SH CR/8 (SUTURE) IMPLANT
SUT STEEL 6MS V (SUTURE) ×6 IMPLANT
SUT STEEL STERNAL CCS#1 18IN (SUTURE) ×3 IMPLANT
SUT STEEL SZ 6 DBL 3X14 BALL (SUTURE) ×3 IMPLANT
SUT VIC AB 1 CTX 18 (SUTURE) IMPLANT
SUT VIC AB 1 CTX 36 (SUTURE) ×4
SUT VIC AB 1 CTX36XBRD ANBCTR (SUTURE) ×2 IMPLANT
SUT VIC AB 2-0 CT1 27 (SUTURE) ×4
SUT VIC AB 2-0 CT1 TAPERPNT 27 (SUTURE) ×2 IMPLANT
SUT VIC AB 2-0 CTX 27 (SUTURE) IMPLANT
SUT VIC AB 3-0 SH 27 (SUTURE)
SUT VIC AB 3-0 SH 27X BRD (SUTURE) IMPLANT
SUT VIC AB 3-0 X1 27 (SUTURE) IMPLANT
SUT VICRYL 4-0 PS2 18IN ABS (SUTURE) IMPLANT
SUTURE E-PAK OPEN HEART (SUTURE) ×3 IMPLANT
SYSTEM SAHARA CHEST DRAIN ATS (WOUND CARE) ×3 IMPLANT
TAPE CLOTH SURG 4X10 WHT LF (GAUZE/BANDAGES/DRESSINGS) ×3 IMPLANT
TOWEL OR 17X24 6PK STRL BLUE (TOWEL DISPOSABLE) ×6 IMPLANT
TOWEL OR 17X26 10 PK STRL BLUE (TOWEL DISPOSABLE) ×6 IMPLANT
TRAY FOLEY IC TEMP SENS 14FR (CATHETERS) ×3 IMPLANT
TUBING INSUFFLATION 10FT LAP (TUBING) ×3 IMPLANT
UNDERPAD 30X30 INCONTINENT (UNDERPADS AND DIAPERS) ×3 IMPLANT
WATER STERILE IRR 1000ML POUR (IV SOLUTION) ×6 IMPLANT

## 2012-03-03 NOTE — Anesthesia Procedure Notes (Addendum)
Procedures RIJ PA catheter: PA catheter:  Routine monitors. Timeout, sterile prep, drape, FBP R neck.  Trendelenburg position.  1% Lido local, finder and trocar RIJ 1st pass with US guidance.  Double lumen cordis placed over J wire. PA catheter in easily.  Sterile dressing applied.  Patient tolerated well, VSS.  Sandford Craze, MD  06:50-07:11  TEE Placement 0800: TEE heavily lubricated and placed in a sleeve and placed down the oropharynx without difficulty. CE

## 2012-03-03 NOTE — Progress Notes (Signed)
  Echocardiogram Echocardiogram Transesophageal has been performed.  Victoria Bell [facility] 03/03/2012, 10:24 AM

## 2012-03-03 NOTE — Preoperative (Signed)
Beta Blockers   Reason not to administer Beta Blockers:Not Applicable 

## 2012-03-03 NOTE — Progress Notes (Signed)
ANTICOAGULATION CONSULT NOTE  Pharmacy Consult for heparin Indication: s/p cath; left main disease, CABG evaluation pending  No Known Allergies  Patient Measurements: Height: 5\' 1"  (154.9 cm) Weight: 193 lb 2 oz (87.6 kg) IBW/kg (Calculated) : 47.8  Heparin Dosing Weight: 68  Vital Signs: Temp: 97.7 F (36.5 C) (05/23 0000) Temp src: Oral (05/23 0000) BP: 105/38 mmHg (05/23 0200) Pulse Rate: 85  (05/23 0200)  Labs:  Basename 03/03/12 0126 03/03/12 0113 03/02/12 2018 03/02/12 1140 03/02/12 0415  HGB 13.5 -- -- -- 13.7  HCT 42.0 -- -- -- 43.1  PLT 134* -- -- -- 147*  APTT -- 126* -- -- --  LABPROT -- -- -- -- 14.2  INR -- -- -- -- 1.08  HEPARINUNFRC -- 0.36 -- -- --  CREATININE -- -- -- -- 0.40*  CKTOTAL -- -- 26 29 29   CKMB -- -- 1.9 2.2 2.1  TROPONINI -- -- <0.30 <0.30 0.48*    Estimated Creatinine Clearance: 83.7 ml/min (by C-G formula based on Cr of 0.4).  Assessment: 51 yo with CAD s/p cath, CABG in AM, for Heparin  Goal of Therapy:  Heparin level 0.3-0.7 units/ml Monitor platelets by anticoagulation protocol: Yes   Plan:  Continue Heparin at current rate F/U after OR  Geannie Risen, PharmD, BCPS   03/03/2012 3:36 AM

## 2012-03-03 NOTE — Progress Notes (Signed)
Patient ID: Victoria Bell, female   DOB: [birth year], 51 y.o.   MRN: [id number]  Filed Vitals:   03/03/12 1745 03/03/12 1800 03/03/12 1835 03/03/12 1838  BP:  97/49    Pulse: 90 100 93 97  Temp: 99.7 F (37.6 C) 99.7 F (37.6 C) 99.3 F (37.4 C)   TempSrc:      Resp: 12 15 28    Height:      Weight:      SpO2: 100% 99% 99%     Waking up. Still on vent. Urine output good. CT output low. CBC    Component Value Date/Time   WBC 14.5* 03/03/2012 1500   RBC 3.10* 03/03/2012 1500   HGB 10.2* 03/03/2012 1508   HCT 30.0* 03/03/2012 1508   PLT 123* 03/03/2012 1500   MCV 92.6 03/03/2012 1500   MCH 29.4 03/03/2012 1500   MCHC 31.7 03/03/2012 1500   RDW 14.6 03/03/2012 1500    BMET    Component Value Date/Time   NA 144 03/03/2012 1508   K 3.4* 03/03/2012 1508   CL 104 03/03/2012 0607   CO2 27 03/03/2012 0607   GLUCOSE 111* 03/03/2012 1508   BUN 9 03/03/2012 0607   CREATININE 0.45* 03/03/2012 0607   CALCIUM 9.3 03/03/2012 0607   GFRNONAA >90 03/03/2012 0607   GFRAA >90 03/03/2012 0607    A/P:  Stable postop. Wean vent as tolerated.

## 2012-03-03 NOTE — Brief Op Note (Signed)
03/02/2012 - 03/03/2012  12:54 PM  PATIENT:  Victoria Bell  51 y.o. female  PRE-OPERATIVE DIAGNOSIS:  1.S/p NSTEMI 2. CAD (High grade stenosis of Left Main)  POST-OPERATIVE DIAGNOSIS:  1.S/p NSTEMI 2. CAD (High grade stenosis of Left Main)   PROCEDURE:  CORONARY ARTERY BYPASS GRAFTING (CABG) x 2 (LIMA to LAD, SVG to Circumflex) with EVH from right and left thighs.  SURGEON:  Surgeon(s) and Role:    * Kerin Perna, MD - Primary  PHYSICIAN ASSISTANT: Doree Fudge PA-C   ANESTHESIA:   general  EBL:  Total I/O In: 2000 [I.V.:2000] Out: 75 [Urine:75]  BLOOD ADMINISTERED:One FFP and One PLTS  DRAINS:  Chest Tube(s) in the Mediastinal and Pleural Spaces   COUNTS:  YES  DICTATION: .Dragon Dictation  PLAN OF CARE: Admit to inpatient   PATIENT DISPOSITION:  ICU - intubated and hemodynamically stable.   Delay start of Pharmacological VTE agent (>24hrs) due to surgical blood loss or risk of bleeding: yes  PRE OP WEIGHT: 88 kg

## 2012-03-03 NOTE — Anesthesia Postprocedure Evaluation (Signed)
  Anesthesia Post-op Note  Patient: Victoria Bell  Procedure(s) Performed: Procedure(s) (LRB): CORONARY ARTERY BYPASS GRAFTING (CABG) (N/A)  Patient Location: PACU and SICU  Anesthesia Type: General  Level of Consciousness: Patient remains intubated per anesthesia plan  Airway and Oxygen Therapy: Patient remains intubated per anesthesia plan  Post-op Pain: mild  Post-op Assessment: Post-op Vital signs reviewed  Post-op Vital Signs: Reviewed  Complications: No apparent anesthesia complications

## 2012-03-03 NOTE — Transfer of Care (Signed)
Immediate Anesthesia Transfer of Care Note  Patient: Victoria Bell  Procedure(s) Performed: Procedure(s) (LRB): CORONARY ARTERY BYPASS GRAFTING (CABG) (N/A)  Patient Location: PACU and SICU  Anesthesia Type: General  Level of Consciousness: Patient remains intubated per anesthesia plan  Airway & Oxygen Therapy: Patient remains intubated per anesthesia plan  Post-op Assessment: Report given to PACU RN  Post vital signs: Reviewed  Complications: No apparent anesthesia complications

## 2012-03-03 NOTE — Anesthesia Preprocedure Evaluation (Addendum)
Anesthesia Evaluation  Patient identified by MRN, date of birth, ID band Patient awake    Reviewed: Allergy & Precautions, H&P , NPO status , Patient's Chart, lab work & pertinent test results  Airway Mallampati: II      Dental   Pulmonary shortness of breath and with exertion, COPD breath sounds clear to auscultation        Cardiovascular hypertension, + CAD and + Past MI     Neuro/Psych Anxiety    GI/Hepatic Neg liver ROS, GERD-  Controlled,  Endo/Other  Diabetes mellitus-  Renal/GU negative Renal ROS     Musculoskeletal   Abdominal   Peds  Hematology negative hematology ROS (+)   Anesthesia Other Findings   Reproductive/Obstetrics                           Anesthesia Physical Anesthesia Plan  ASA: IV  Anesthesia Plan: General   Post-op Pain Management:    Induction: Intravenous  Airway Management Planned: Oral ETT  Additional Equipment: PA Cath, TEE, Arterial line and Ultrasound Guidance Line Placement  Intra-op Plan:   Post-operative Plan: Post-operative intubation/ventilation  Informed Consent: I have reviewed the patients History and Physical, chart, labs and discussed the procedure including the risks, benefits and alternatives for the proposed anesthesia with the patient or authorized representative who has indicated his/her understanding and acceptance.     Plan Discussed with: CRNA  Anesthesia Plan Comments:        Anesthesia Quick Evaluation

## 2012-03-04 ENCOUNTER — Inpatient Hospital Stay (HOSPITAL_COMMUNITY): Payer: Medicaid Other

## 2012-03-04 DIAGNOSIS — Z951 Presence of aortocoronary bypass graft: Secondary | ICD-10-CM

## 2012-03-04 LAB — CBC
HCT: 27.1 % — ABNORMAL LOW (ref 36.0–46.0)
Hemoglobin: 8.8 g/dL — ABNORMAL LOW (ref 12.0–15.0)
Hemoglobin: 8.5 g/dL — ABNORMAL LOW (ref 12.0–15.0)
MCHC: 32.5 g/dL (ref 30.0–36.0)
MCHC: 31.6 g/dL (ref 30.0–36.0)
MCV: 92.8 fL (ref 78.0–100.0)
RBC: 2.86 MIL/uL — ABNORMAL LOW (ref 3.87–5.11)
RDW: 14.7 % (ref 11.5–15.5)
WBC: 11.8 10*3/uL — ABNORMAL HIGH (ref 4.0–10.5)
WBC: 11.7 10*3/uL — ABNORMAL HIGH (ref 4.0–10.5)

## 2012-03-04 LAB — PREPARE FRESH FROZEN PLASMA: Unit division: 0

## 2012-03-04 LAB — BASIC METABOLIC PANEL
BUN: 12 mg/dL (ref 6–23)
Chloride: 108 mEq/L (ref 96–112)
Creatinine, Ser: 0.42 mg/dL — ABNORMAL LOW (ref 0.50–1.10)
GFR calc Af Amer: >90 mL/min (ref >90)
Glucose, Bld: 129 mg/dL — ABNORMAL HIGH (ref 70–99)
Potassium: 4.7 mEq/L (ref 3.5–5.1)

## 2012-03-04 LAB — POCT I-STAT 3, ART BLOOD GAS (G3+)
Acid-Base Excess: 2 mmol/L (ref 0.0–2.0)
Bicarbonate: 27.2 mEq/L — ABNORMAL HIGH (ref 20.0–24.0)
Patient temperature: 38.1
Patient temperature: 38
TCO2: 28 mmol/L (ref 0–100)
pCO2 arterial: 44.2 mmHg (ref 35.0–45.0)
pH, Arterial: 7.353 (ref 7.350–7.400)
pO2, Arterial: 108 mmHg — ABNORMAL HIGH (ref 80.0–100.0)

## 2012-03-04 LAB — CREATININE, SERUM
Creatinine, Ser: 0.41 mg/dL — ABNORMAL LOW (ref 0.50–1.10)
GFR calc non Af Amer: >90 mL/min (ref >90)

## 2012-03-04 LAB — GLUCOSE, CAPILLARY
Glucose-Capillary: 124 mg/dL — ABNORMAL HIGH (ref 70–99)
Glucose-Capillary: 132 mg/dL — ABNORMAL HIGH (ref 70–99)
Glucose-Capillary: 132 mg/dL — ABNORMAL HIGH (ref 70–99)
Glucose-Capillary: 139 mg/dL — ABNORMAL HIGH (ref 70–99)
Glucose-Capillary: 144 mg/dL — ABNORMAL HIGH (ref 70–99)
Glucose-Capillary: 145 mg/dL — ABNORMAL HIGH (ref 70–99)

## 2012-03-04 LAB — BUN: BUN: 14 mg/dL (ref 6–23)

## 2012-03-04 LAB — PREPARE PLATELET PHERESIS

## 2012-03-04 LAB — POCT I-STAT, CHEM 8
Calcium, Ion: 1.17 mmol/L (ref 1.12–1.32)
Glucose, Bld: 140 mg/dL — ABNORMAL HIGH (ref 70–99)
HCT: 26 % — ABNORMAL LOW (ref 36.0–46.0)
Hemoglobin: 8.8 g/dL — ABNORMAL LOW (ref 12.0–15.0)
Potassium: 4.1 mEq/L (ref 3.5–5.1)

## 2012-03-04 LAB — MAGNESIUM: Magnesium: 2.3 mg/dL (ref 1.5–2.5)

## 2012-03-04 MED ORDER — INSULIN GLARGINE 100 UNIT/ML ~~LOC~~ SOLN
14.0000 [IU] | SUBCUTANEOUS | Status: DC
  Administered 2012-03-04 – 2012-03-05 (×2): 14 [IU] via SUBCUTANEOUS
  Administered 2012-03-06: 10:00:00 via SUBCUTANEOUS
  Administered 2012-03-07 – 2012-03-08 (×2): 14 [IU] via SUBCUTANEOUS

## 2012-03-04 MED ORDER — TRAMADOL HCL 50 MG PO TABS
50.0000 mg | ORAL_TABLET | Freq: Four times a day (QID) | ORAL | Status: DC | PRN
  Administered 2012-03-04 – 2012-03-12 (×4): 50 mg via ORAL
  Filled 2012-03-04 (×6): qty 1

## 2012-03-04 MED ORDER — ALPRAZOLAM 0.5 MG PO TABS
1.5000 mg | ORAL_TABLET | Freq: Two times a day (BID) | ORAL | Status: DC
  Administered 2012-03-04 – 2012-03-06 (×3): 1.5 mg via ORAL
  Administered 2012-03-06: 0.5 mg via ORAL
  Administered 2012-03-07 – 2012-03-14 (×15): 1.5 mg via ORAL
  Filled 2012-03-04 (×10): qty 3
  Filled 2012-03-04: qty 1
  Filled 2012-03-04 (×9): qty 3

## 2012-03-04 MED ORDER — FUROSEMIDE 10 MG/ML IJ SOLN
20.0000 mg | Freq: Two times a day (BID) | INTRAMUSCULAR | Status: DC
  Administered 2012-03-04 – 2012-03-05 (×2): 20 mg via INTRAVENOUS
  Filled 2012-03-04 (×6): qty 2

## 2012-03-04 MED ORDER — FUROSEMIDE 10 MG/ML IJ SOLN
40.0000 mg | Freq: Once | INTRAMUSCULAR | Status: AC
  Administered 2012-03-04: 40 mg via INTRAVENOUS
  Filled 2012-03-04 (×2): qty 4

## 2012-03-04 MED ORDER — DM-GUAIFENESIN ER 30-600 MG PO TB12
1.0000 | ORAL_TABLET | Freq: Two times a day (BID) | ORAL | Status: DC
  Administered 2012-03-04 – 2012-03-15 (×22): 1 via ORAL
  Filled 2012-03-04 (×24): qty 1

## 2012-03-04 MED ORDER — INSULIN ASPART 100 UNIT/ML ~~LOC~~ SOLN
0.0000 [IU] | SUBCUTANEOUS | Status: DC
  Administered 2012-03-04 – 2012-03-05 (×5): 2 [IU] via SUBCUTANEOUS
  Administered 2012-03-06: 08:00:00 via SUBCUTANEOUS
  Administered 2012-03-06 – 2012-03-07 (×3): 2 [IU] via SUBCUTANEOUS

## 2012-03-04 MED ORDER — NICOTINE 21 MG/24HR TD PT24
21.0000 mg | MEDICATED_PATCH | Freq: Every day | TRANSDERMAL | Status: DC
  Administered 2012-03-04: 21 mg via TRANSDERMAL
  Filled 2012-03-04: qty 1

## 2012-03-04 MED FILL — Nicardipine HCl IV Soln 2.5 MG/ML: INTRAVENOUS | Qty: 1 | Status: AC

## 2012-03-04 NOTE — Progress Notes (Signed)
Inpatient Diabetes Program Recommendations  AACE/ADA: New Consensus Statement on Inpatient Glycemic Control (2009)  Target Ranges:  Prepandial:   less than 140 mg/dL      Peak postprandial:   less than 180 mg/dL (1-2 hours)      Critically ill patients:  140 - 180 mg/dL   Reason for Visit: Note patient's history of diabetes.  Her A1C prior to surgery was 5.9%.  Transitioned off insulin drip to Correction Novolog and Lantus 14 units.  Will not need Lantus after discharge based on A1C.  CBG's look good today.

## 2012-03-04 NOTE — Op Note (Signed)
NAME:  Victoria Bell, Victoria Bell              ACCOUNT NO.:  [id number]  MEDICAL RECORD NO.:  [id number]  LOCATION:  2307                         FACILITY:  [facility]  PHYSICIAN:  Kerin Perna, M.D.  DATE OF BIRTH:  13-Apr-1961  DATE OF PROCEDURE:  03/03/2012 DATE OF DISCHARGE:                              OPERATIVE REPORT   OPERATIONS: 1. Coronary artery bypass grafting x2 (left internal mammary artery to     left anterior descending artery, saphenous vein graft to obtuse     marginal). 2. Endoscopic harvest of bilateral leg, greater saphenous vein.  SURGEON:  Kerin Perna, MD  ASSISTANT:  Doree Fudge, PA-C  PREOPERATIVE DIAGNOSIS:  Critical distal left main stenosis, 95%, with unstable angina and positive cardiac enzymes.  INDICATIONS:  The patient is a 51 year old obese diabetic smoker with unstable angina, transferred to this hospital with recent accelerating angina and positive troponin.  Cardiac catheterization demonstrated a 95% long stenosis of the distal left main with preserved LV function. The patient was referred for surgical coronary revascularization.  Prior to surgery, I examined the patient in the CCU and reviewed the results of the cardiac catheterization with the patient and family.  I discussed the indications and expected benefits of coronary bypass grafting for treatment of her coronary artery disease and left main stenosis.  I discussed the major aspects of surgery including the location of the surgical incisions, the use of general anesthesia, cardiopulmonary bypass, and expected postoperative hospital recovery.  I reviewed with the patient the risks to her of this operation to including risks of MI, stroke, bleeding, infection, and death.  After reviewing these issues, she demonstrated her understanding and agreed to proceed with the surgery under what I felt was an informed consent.  FINDINGS: 1. Difficult exposure of the heart and mammary artery due to  obese     body habitus and short stature. 2. Suboptimal vein harvested from the right leg necessitating     endoscopic vein harvest on the left leg, which was adequate. 3. Intraoperative coagulopathy treated with platelets and FFP and     reversed. 4. Severe COPD.  PROCEDURE:  The patient was brought to the OR, placed in supine on the operating room table where general anesthesia was induced.  A proper time-out was performed.  The chest, abdomen and legs were prepped with Betadine and draped as a sterile field.  A sternal incision was made as the saphenous vein was harvested endoscopically from both legs.  The left internal mammary artery was harvested as a pedicle graft from its origin at the subclavian vessels.  It was 1.2-mm vessel with good flow. The sternal retractor was placed using the deep blades.  The pericardium was opened and suspended.  Pursestrings were placed in the ascending aorta and right atrium.  After the vein had been harvested, the patient was fully heparinized and cannulated and placed on cardiopulmonary bypass.  The coronary arteries were identified for grafting in the LAD and OM vessels were adequate targets.  Cardioplegia catheters were placed for both antegrade and retrograde cold blood cardioplegia, and the patient was cooled to 32 degrees.  The aortic crossclamp was applied.  An 800 mL of  cold blood cardioplegia was delivered in split doses between the antegrade aortic and retrograde coronary sinus catheters.  There was good cardioplegic arrest and septal temperature dropped less than 12 degrees.  Cardioplegia was delivered every 20 minutes.  The distal coronary anastomoses were then performed.  The first distal anastomosis was to the OM branch of the left circumflex.  This was 1.5- mm vessel.  A reverse saphenous vein was sewn end-to-side with running 7- 0 Prolene with good flow through the graft.  Cardioplegia was redosed. The second distal anastomosis  was to the distal third of the LAD.  This was a 1.3-mm vessel.  The left IMA pedicle was brought through an opening, created, and the left lateral pericardium was brought down onto the LAD and sewn end-to-side with running 8-0 Prolene.  There was good flow through the anastomosis after briefly releasing the pedicle bulldog on the mammary artery.  The bulldog was reapplied and the pedicle was secured.  Cardioplegia was redosed.  While the crossclamp was still in place, the proximal vein anastomosis was placed on the ascending aorta using a 4.5-mm punch with running 7-0 Prolene.  Air was vented from the coronary arteries with the dose of retrograde warm blood cardioplegia.  The crossclamp was removed.  The heart was cardioverted back to a regular rhythm.  Air was aspirated from the vein grafts and was opened and had good flow.  Hemostasis was documented at the proximal and distal sites.  The cardioplegia cannula was removed.  Pacing wires were applied.  The patient was warmed to 37 degrees and the ventilator was resumed.  The patient was weaned from bypass on low-dose dopamine with excellent hemodynamics.  Echo showed good LV function.  Protamine was administered without adverse reaction. The cannula was removed.  The mediastinum was irrigated and the superior pericardial fat was closed over the aorta.  An anterior mediastinal and a left pleural chest tube were placed and brought through separate incisions.  The sternum was closed with wire.  The pectoralis fascia was closed using running #1 Vicryl and the subcutaneous and skin layers were closed using running Vicryl.  Total cardiopulmonary bypass time was 95 minutes. Due to the patient's obese body habitus and short stature, redo operation would be very hazardous and trying to expose the heart or the bypass grafts.     Kerin Perna, M.D.     PV/MEDQ  D:  03/03/2012  T:  03/04/2012  Job:  [id number]  cc:   Nanetta Batty,  M.D.

## 2012-03-04 NOTE — Procedures (Signed)
Extubation Procedure Note  Patient Details:   Name: Victoria Bell DOB: 10/29/1960 MRN: [id number]   Airway Documentation:  Airway 8 mm (Active)  Secured at (cm) 21 cm 03/04/2012  4:40 AM  Measured From Lips 03/04/2012  4:40 AM  Secured Location Left 03/04/2012  4:40 AM  Secured By [employer] 03/04/2012  4:40 AM  Tube Holder Repositioned Yes 03/04/2012  4:40 AM  Cuff Pressure (cm H2O) 28 cm H2O 03/04/2012  4:40 AM  Site Condition Dry 03/04/2012  4:40 AM    Evaluation  O2 sats: stable throughout Complications: No apparent complications Patient did tolerate procedure well. Bilateral Breath Sounds: Clear Suctioning: Airway Yes  Melanee Spry 03/04/2012, 7:47 AM

## 2012-03-04 NOTE — Plan of Care (Signed)
Problem: Phase I Progression Outcomes Goal: Last BM documented (see I&O) Outcome: Not Met (add Reason) Not documented, pt on vent

## 2012-03-04 NOTE — Progress Notes (Addendum)
Subjective: Resting, after Bath, fatigued; Back sore Hard time breathing  POST-OP DAY 1 1. Coronary artery bypass grafting x2 (left internal mammary artery to  left anterior descending artery, saphenous vein graft to obtuse  marginal).  2. Endoscopic harvest of bilateral leg, greater saphenous vein.   Objective: Vital signs in last 24 hours: Temp:  [98.6 F (37 C)-100.9 F (38.3 C)] 99.9 F (37.7 C) (05/24 0745) Pulse Rate:  [86-100] 90  (05/24 0745) Resp:  [12-31] 25  (05/24 0745) BP: (71-133)/(33-56) 110/51 mmHg (05/24 0745) SpO2:  [94 %-100 %] 100 % (05/24 0805) FiO2 (%):  [40 %-50 %] 40 % (05/24 1610) Weight:  [94.4 kg (208 lb 1.8 oz)] 94.4 kg (208 lb 1.8 oz) (05/24 0600) Weight change: 6.8 kg (14 lb 15.9 oz) Last BM Date:  (PTA) Intake/Output from previous day:  +3415 05/23 0701 - 05/24 0700 In: 6945.9 [I.V.:4504.9; Blood:651; NG/GT:210; IV Piggyback:1580] Out: 3550 [Urine:2070; Emesis/NG output:400; Blood:600; Chest Tube:480] Intake/Output this shift:    PE: General:alert and oriented, pleasant affect Skin:cool and dry Heart:S1S2 RRR Lungs:+ Rhonchi and wheezes --diffuse rhonchorous expiratory wheezing making heart sounds difficult to hear Abd:+ BS,  Ext:tr edema Neuro:alert and oriented X 3 MAE, follows commands  Hemodynamic parameters for last 24 hours:  PAP: (27-45)/(15-25) 34/21 mmHg  CVP: [10 mmHg-32 mmHg] 20 mmHg  CO: [6.1 L/min-10.3 L/min] 6.5 L/min  CI: [3.3 L/min/m2-5.5 L/min/m2] 3.5 L/min/m2     Lab Results:  Basename 03/04/12 0330 03/03/12 2055  WBC 11.8* 13.9*  HGB 8.8* 8.9*  HCT 27.1* 28.5*  PLT 108* 122*   BMET  Basename 03/04/12 0330 03/03/12 2055 03/03/12 1508 03/03/12 0607  NA 140 -- 144 --  K 4.7 -- 3.4* --  CL 108 -- -- 104  CO2 26 -- -- 27  GLUCOSE 129* -- 111* --  BUN 12 -- -- 9  CREATININE 0.42* 0.46* -- --  CALCIUM 7.7* -- -- 9.3    Basename 03/02/12 2018 03/02/12 1140  TROPONINI <0.30 <0.30    Lab Results    Component Value Date   CHOL 171 03/02/2012   HDL 50 03/02/2012   LDLCALC 107* 03/02/2012   TRIG 68 03/02/2012   CHOLHDL 3.4 03/02/2012   Lab Results  Component Value Date   HGBA1C 5.9* 03/02/2012     Lab Results  Component Value Date   TSH 1.272 03/02/2012    Hepatic Function Panel  Basename 03/02/12 0415  PROT 7.1  ALBUMIN 3.0*  AST 35  ALT 19  ALKPHOS 96  BILITOT 0.6  BILIDIR --  IBILI --    Basename 03/02/12 1140  CHOL 171   No results found for this basename: PROTIME in the last 72 hours  Studies/Results: Dg Chest Portable 1 View In Am  03/04/2012  *RADIOLOGY REPORT*  Clinical Data: Postoperative evaluation  PORTABLE CHEST - 1 VIEW  Comparison: 03/03/2012  Findings: The endotracheal tube, right internal jugular Swan-Ganz catheter, nasogastric tube and left chest tube remain in place. The tip of the nasogastric tube extends beyond the inferior aspect of the film.  The patient is status post median sternotomy and CABG.  A stable degree of cardiomegaly is noted and mediastinal contour is stable.  An unchanged degree of left lower lobe density is noted and most likely represents postoperative atelectasis with an area of focal pneumonitis not excluded.  Milder linear date density at the right lung base correlates with linear volume loss.  No new areas of atelectasis or infiltrate are seen.  No pneumothorax is noted.  IMPRESSION: Stable postoperative cardiopulmonary appearance with persistent left lower lobe infiltrate, question atelectasis versus pneumonitis.  Original Report Authenticated By: Bertha Stakes, M.D.   Dg Chest Portable 1 View  03/03/2012  *RADIOLOGY REPORT*  Clinical Data: Postop.  PORTABLE CHEST - 1 VIEW  Comparison: Yesterday.  Findings: Interval median sternotomy wires and post CABG changes. Interval nasogastric tube extending into the stomach.  Interval left chest tube without pneumothorax.  Interval endotracheal tube in satisfactory position.  Interval right  jugular Swan-Ganz catheter with its tip in the main pulmonary artery or proximal right main pulmonary artery, without pneumothorax.  Interval increased density at the left lung base, including the left lower lobe.  Interval small amount of linear density at the right lung base.  Interval epicardial pacer leads.  IMPRESSION:  Interval post CABG changes with mild right basilar atelectasis and more pronounced left basilar atelectasis.  Aspiration pneumonitis could have a similar appearance at the left lung base.  Original Report Authenticated By: Darrol Angel, M.D.    Medications: I have reviewed the patient's current medications.    Marland Kitchen acetaminophen (TYLENOL) oral liquid 160 mg/5 mL  650 mg Per Tube NOW   Or  . acetaminophen  650 mg Rectal NOW  . acetaminophen  1,000 mg Oral Q6H   Or  . acetaminophen (TYLENOL) oral liquid 160 mg/5 mL  975 mg Per Tube Q6H  . ALPRAZolam  1 mg Oral BID  . aspirin EC  325 mg Oral Daily   Or  . aspirin  324 mg Per Tube Daily  . bisacodyl  10 mg Oral Daily   Or  . bisacodyl  10 mg Rectal Daily  . budesonide  0.5 mg Nebulization BID  . cefUROXime (ZINACEF)  IV  1.5 g Intravenous To OR  . cefUROXime (ZINACEF)  IV  1.5 g Intravenous Q12H  . dextromethorphan-guaiFENesin  1 tablet Oral BID  . docusate sodium  200 mg Oral Daily  . DOPamine  2-20 mcg/kg/min Intravenous To OR  . famotidine (PEPCID) IV  20 mg Intravenous Q12H  . furosemide  40 mg Intravenous Once  . insulin aspart  0-24 Units Subcutaneous Q2H   Followed by  . insulin aspart  0-24 Units Subcutaneous Q4H  . insulin aspart  0-24 Units Subcutaneous Q4H  . insulin glargine  14 Units Subcutaneous BH-q7a  . levalbuterol  0.63 mg Nebulization TID BM  . magnesium sulfate  4 g Intravenous Once  . metoprolol tartrate  12.5 mg Oral BID   Or  . metoprolol tartrate  12.5 mg Per Tube BID  . nicotine  21 mg Transdermal Daily  . nitroGLYCERIN  2-200 mcg/min Intravenous To OR  .  nitroglycerin-nicardipine-HEPARIN-sodium bicarbonate irrigation for artery spasm   Irrigation To OR  . pantoprazole  40 mg Oral Q1200  . potassium chloride  10 mEq Intravenous Q1 Hr x 3  . potassium chloride  10 mEq Intravenous Once  . simvastatin  20 mg Oral q1800  . sodium chloride  3 mL Intravenous Q12H  . vancomycin  1,000 mg Intravenous Once  . DISCONTD: acetaminophen (TYLENOL) oral liquid 160 mg/5 mL  975 mg Per Tube Q6H   Assessment/Plan: Patient Active Problem List  Diagnoses  . Exertional dyspnea  . Anxiety disorder  . CAD, ? Stent at [facility] in 2001, cath 03/02/12 with 95% Lt. Main  . GERD (gastroesophageal reflux disease)  . NSTEMI, Troponin 0.83 on admission to [facility]  . COPD (chronic  obstructive pulmonary disease)  . Smoker, 2ppd  . Obesity  . 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  Now extubated    LOS: 2 days   INGOLD,LAURA R 03/04/2012, 10:49 AM  ATTENDING ATTESTATION:  I have seen and examined the patient along with Nada Boozer, PA.  I have reviewed the chart, notes and new data.  I agree with Laura's note.  I agree with her physical exam.  Brief Description: Fortunate obese 51 y/o long term smoker with likely COPD& prior PCI p/w with ACS --> 95% distal LM --> urgent CABGx2 (LIMA-LAD, SVG-OM) -POD #1 Delayed extubation.  Remains on pressors - Neo & Dopamine.  -- difficult to wean as BP borderline. UOP ok & renal Fxn stable.   Key new complaints: dyspnea  Key examination changes: wheezes, mild edema  Key new findings / data: expected drop in H/H, but stable now.  PLAN:  No active cardiac Sx - stable rhythm on pressors  Slow wean off pressors - with low dose BB ordered.  Continue diuresis  On Abx for ? Possible PNA -- I think a major concern will be COPD  recovery; nebs/ flutter valve & IS  DM coverage with Lantus & SSI.  Statin for HLD  Will follow.  Marykay Lex, M.D., M.S. [facility] [address] [place], [place]  [address]  [phone]  03/04/2012 12:04 PM

## 2012-03-04 NOTE — Progress Notes (Signed)
Patient turned to 40/4.  Following commands, cooperative.  Denies pain.  AM ABG good (see results review).  Precedex gtt off.

## 2012-03-04 NOTE — Progress Notes (Signed)
NIF -40 7 VC PT EXTUBATED TO A 4LPM [place], TOLERATING WELL. PT PULLED [phone] ON IS.

## 2012-03-04 NOTE — Progress Notes (Signed)
1 Day Post-Op Procedure(s) (LRB): CORONARY ARTERY BYPASS GRAFTING (CABG) (N/A)                     [address]            [place] [address]          [phone]      Subjective: >95 L main unstable angina Severe COPD 2ppd smoker Delayed extubation - pCO2 65 on cpap last pm Now extubated breathing w/o problem  Objective: Vital signs in last 24 hours: Temp:  [98.6 F (37 C)-100.9 F (38.3 C)] 99.9 F (37.7 C) (05/24 0745) Pulse Rate:  [86-100] 90  (05/24 0745) Cardiac Rhythm:  [-] Normal sinus rhythm (05/24 0445) Resp:  [12-31] 25  (05/24 0745) BP: (71-133)/(33-56) 110/51 mmHg (05/24 0745) SpO2:  [94 %-100 %] 100 % (05/24 0745) FiO2 (%):  [40 %-50 %] 40 % (05/24 9147) Weight:  [208 lb 1.8 oz (94.4 kg)] 208 lb 1.8 oz (94.4 kg) (05/24 0600)  Hemodynamic parameters for last 24 hours: PAP: (27-45)/(15-25) 34/21 mmHg CVP:  [10 mmHg-32 mmHg] 20 mmHg CO:  [6.1 L/min-10.3 L/min] 6.5 L/min CI:  [3.3 L/min/m2-5.5 L/min/m2] 3.5 L/min/m2  Intake/Output from previous day: 05/23 0701 - 05/24 0700 In: 6945.9 [I.V.:4504.9; Blood:651; NG/GT:210; IV Piggyback:1580] Out: 3550 [Urine:2070; Emesis/NG output:400; Blood:600; Chest Tube:480] Intake/Output this shift:    EXAM  distant bs extren cool Card index > 3.0   Lab Results:  Basename 03/04/12 0330 03/03/12 2055  WBC 11.8* 13.9*  HGB 8.8* 8.9*  HCT 27.1* 28.5*  PLT 108* 122*   BMET:  Basename 03/04/12 0330 03/03/12 2055 03/03/12 1508 03/03/12 0607  NA 140 -- 144 --  K 4.7 -- 3.4* --  CL 108 -- -- 104  CO2 26 -- -- 27  GLUCOSE 129* -- 111* --  BUN 12 -- -- 9  CREATININE 0.42* 0.46* -- --  CALCIUM 7.7* -- -- 9.3    PT/INR:  Basename 03/03/12 1500  LABPROT 18.1*  INR 1.47   ABG    Component Value Date/Time   PHART 7.353 03/04/2012 0715   HCO3 26.5* 03/04/2012 0715   TCO2 28 03/04/2012 0715   ACIDBASEDEF 1.0 03/03/2012 1344   O2SAT 98.0 03/04/2012 0715   CBG (last 3)   Basename 03/04/12 0328  03/03/12 2343 03/03/12 2216  GLUCAP 124* 132* 165*    Assessment/Plan: S/P Procedure(s) (LRB): CORONARY ARTERY BYPASS GRAFTING (CABG) (N/A) See progression orders Cont XANX, daily   nicoderm patch  LOS: 2 days    [name],Doylene Splinter 03/04/2012

## 2012-03-04 NOTE — Progress Notes (Signed)
UR Completed.  [place] [phone] 03/04/2012

## 2012-03-05 ENCOUNTER — Inpatient Hospital Stay (HOSPITAL_COMMUNITY): Payer: Medicaid Other

## 2012-03-05 LAB — CBC
HCT: 28.6 % — ABNORMAL LOW (ref 36.0–46.0)
Hemoglobin: 9 g/dL — ABNORMAL LOW (ref 12.0–15.0)
MCH: 29.6 pg (ref 26.0–34.0)
MCHC: 31.5 g/dL (ref 30.0–36.0)
MCV: 94.1 fL (ref 78.0–100.0)
Platelets: 77 10*3/uL — ABNORMAL LOW (ref 150–400)
RBC: 3.04 MIL/uL — ABNORMAL LOW (ref 3.87–5.11)
RDW: 14.7 % (ref 11.5–15.5)
WBC: 11.9 10*3/uL — ABNORMAL HIGH (ref 4.0–10.5)

## 2012-03-05 LAB — BASIC METABOLIC PANEL
BUN: 17 mg/dL (ref 6–23)
CO2: 31 mEq/L (ref 19–32)
Calcium: 8.2 mg/dL — ABNORMAL LOW (ref 8.4–10.5)
Chloride: 101 mEq/L (ref 96–112)
Creatinine, Ser: 0.4 mg/dL — ABNORMAL LOW (ref 0.50–1.10)
GFR calc Af Amer: >90 mL/min (ref >90)
GFR calc non Af Amer: >90 mL/min (ref >90)
Glucose, Bld: 127 mg/dL — ABNORMAL HIGH (ref 70–99)
Potassium: 4.4 mEq/L (ref 3.5–5.1)
Sodium: 137 mEq/L (ref 135–145)

## 2012-03-05 LAB — GLUCOSE, CAPILLARY
Glucose-Capillary: 129 mg/dL — ABNORMAL HIGH (ref 70–99)
Glucose-Capillary: 115 mg/dL — ABNORMAL HIGH (ref 70–99)
Glucose-Capillary: 103 mg/dL — ABNORMAL HIGH (ref 70–99)

## 2012-03-05 LAB — URINALYSIS, ROUTINE W REFLEX MICROSCOPIC
Bilirubin Urine: NEGATIVE
Glucose, UA: NEGATIVE mg/dL
Hgb urine dipstick: NEGATIVE
Ketones, ur: NEGATIVE mg/dL
Leukocytes, UA: NEGATIVE
Nitrite: NEGATIVE
Protein, ur: NEGATIVE mg/dL
Specific Gravity, Urine: 1.017 (ref 1.005–1.030)
Urobilinogen, UA: 0.2 mg/dL (ref 0.0–1.0)
pH: 5.5 (ref 5.0–8.0)

## 2012-03-05 MED ORDER — METOLAZONE 5 MG PO TABS
5.0000 mg | ORAL_TABLET | Freq: Every day | ORAL | Status: AC
  Administered 2012-03-05: 5 mg via ORAL
  Filled 2012-03-05 (×2): qty 1

## 2012-03-05 MED ORDER — FUROSEMIDE 10 MG/ML IJ SOLN
40.0000 mg | Freq: Once | INTRAMUSCULAR | Status: AC
  Administered 2012-03-05: 40 mg via INTRAVENOUS
  Filled 2012-03-05: qty 4

## 2012-03-05 NOTE — Progress Notes (Signed)
Patient examined and record reviewed.Hemodynamics stable,labs satisfactory.Patient had stable day.Continue current care. [name],[place] 03/05/2012    

## 2012-03-05 NOTE — Progress Notes (Signed)
Subjective:  No CP/SOB.  Objective:  Temp:  [97.7 F (36.5 C)-99.3 F (37.4 C)] 98 F (36.7 C) (05/25 0727) Pulse Rate:  [86-101] 94  (05/25 0800) Resp:  [15-26] 17  (05/25 0800) BP: (81-109)/(43-62) 93/55 mmHg (05/25 0800) SpO2:  [94 %-100 %] 98 % (05/25 0800) Weight:  [93.9 kg (207 lb 0.2 oz)] 93.9 kg (207 lb 0.2 oz) (05/25 0600) Weight change: -0.5 kg (-1 lb 1.6 oz)  Intake/Output from previous day: 05/24 0701 - 05/25 0700 In: 1094.6 [P.O.:240; I.V.:752.6; IV Piggyback:102] Out: 2545 [Urine:2545]  Intake/Output from this shift: Total I/O In: -  Out: 45 [Urine:45]  Physical Exam: General appearance: alert and cooperative Neck: no adenopathy, no carotid bruit, no JVD, supple, symmetrical, trachea midline and thyroid not enlarged, symmetric, no tenderness/mass/nodules Lungs: clear to auscultation bilaterally Heart: regular rate and rhythm, S1, S2 normal, no murmur, click, rub or gallop Extremities: extremities normal, atraumatic, no cyanosis or edema  Lab Results: Results for orders placed during the hospital encounter of 03/02/12 (from the past 48 hour(s))  POCT I-STAT 4, (NA,K, GLUC, HGB,HCT)     Status: Abnormal   Collection Time   03/03/12 11:25 AM      Component Value Range Comment   Sodium 142  135 - 145 (mEq/L)    Potassium 4.0  3.5 - 5.1 (mEq/L)    Glucose, Bld 118 (*) 70 - 99 (mg/dL)    HCT 96.2  95.2 - 84.1 (%)    Hemoglobin 12.2  12.0 - 15.0 (g/dL)   POCT I-STAT 4, (NA,K, GLUC, HGB,HCT)     Status: Abnormal   Collection Time   03/03/12 11:46 AM      Component Value Range Comment   Sodium 136  135 - 145 (mEq/L)    Potassium 3.7  3.5 - 5.1 (mEq/L)    Glucose, Bld 107 (*) 70 - 99 (mg/dL)    HCT 32.4 (*) 40.1 - 46.0 (%)    Hemoglobin 8.5 (*) 12.0 - 15.0 (g/dL)   POCT I-STAT 3, BLOOD GAS (G3+)     Status: Abnormal   Collection Time   03/03/12 11:50 AM      Component Value Range Comment   pH, Arterial 7.351  7.350 - 7.400     pCO2 arterial 52.4 (*) 35.0 -  45.0 (mmHg)    pO2, Arterial 337.0 (*) 80.0 - 100.0 (mmHg)    Bicarbonate 29.0 (*) 20.0 - 24.0 (mEq/L)    TCO2 31  0 - 100 (mmol/L)    O2 Saturation 100.0      Acid-Base Excess 3.0 (*) 0.0 - 2.0 (mmol/L)    Sample type ARTERIAL     PREPARE FRESH FROZEN PLASMA     Status: Normal   Collection Time   03/03/12 12:24 PM      Component Value Range Comment   Unit Number [id number]      Blood Component Type THAWED PLASMA      Unit division 00      Status of Unit ISSUED,FINAL      Transfusion Status OK TO TRANSFUSE     PREPARE PLATELET PHERESIS     Status: Normal   Collection Time   03/03/12 12:24 PM      Component Value Range Comment   Unit Number [id number]      Blood Component Type PLTPHER LR1      Unit division 00      Status of Unit ISSUED,FINAL      Transfusion Status OK TO TRANSFUSE  PLATELET COUNT     Status: Normal   Collection Time   03/03/12 12:35 PM      Component Value Range Comment   Platelets 170  150 - 400 (K/uL)   HEMOGLOBIN AND HEMATOCRIT, BLOOD     Status: Abnormal   Collection Time   03/03/12 12:35 PM      Component Value Range Comment   Hemoglobin 8.5 (*) 12.0 - 15.0 (g/dL)    HCT 16.1 (*) 09.6 - 46.0 (%)   POCT I-STAT 4, (NA,K, GLUC, HGB,HCT)     Status: Abnormal   Collection Time   03/03/12 12:47 PM      Component Value Range Comment   Sodium 138  135 - 145 (mEq/L)    Potassium 5.2 (*) 3.5 - 5.1 (mEq/L)    Glucose, Bld 201 (*) 70 - 99 (mg/dL)    HCT 04.5 (*) 40.9 - 46.0 (%)    Hemoglobin 9.2 (*) 12.0 - 15.0 (g/dL)   POCT I-STAT 4, (NA,K, GLUC, HGB,HCT)     Status: Abnormal   Collection Time   03/03/12  1:41 PM      Component Value Range Comment   Sodium 141  135 - 145 (mEq/L)    Potassium 3.9  3.5 - 5.1 (mEq/L)    Glucose, Bld 204 (*) 70 - 99 (mg/dL)    HCT 81.1 (*) 91.4 - 46.0 (%)    Hemoglobin 9.2 (*) 12.0 - 15.0 (g/dL)   POCT I-STAT 3, BLOOD GAS (G3+)     Status: Abnormal   Collection Time   03/03/12  1:44 PM      Component Value Range Comment    pH, Arterial 7.303 (*) 7.350 - 7.400     pCO2 arterial 52.2 (*) 35.0 - 45.0 (mmHg)    pO2, Arterial 122.0 (*) 80.0 - 100.0 (mmHg)    Bicarbonate 25.9 (*) 20.0 - 24.0 (mEq/L)    TCO2 27  0 - 100 (mmol/L)    O2 Saturation 98.0      Acid-base deficit 1.0  0.0 - 2.0 (mmol/L)    Sample type ARTERIAL     CBC     Status: Abnormal   Collection Time   03/03/12  3:00 PM      Component Value Range Comment   WBC 14.5 (*) 4.0 - 10.5 (K/uL)    RBC 3.10 (*) 3.87 - 5.11 (MIL/uL)    Hemoglobin 9.1 (*) 12.0 - 15.0 (g/dL)    HCT 78.2 (*) 95.6 - 46.0 (%)    MCV 92.6  78.0 - 100.0 (fL)    MCH 29.4  26.0 - 34.0 (pg)    MCHC 31.7  30.0 - 36.0 (g/dL)    RDW 21.3  08.6 - 57.8 (%)    Platelets 123 (*) 150 - 400 (K/uL)   PROTIME-INR     Status: Abnormal   Collection Time   03/03/12  3:00 PM      Component Value Range Comment   Prothrombin Time 18.1 (*) 11.6 - 15.2 (seconds)    INR 1.47  0.00 - 1.49    APTT     Status: Abnormal   Collection Time   03/03/12  3:00 PM      Component Value Range Comment   aPTT 50 (*) 24 - 37 (seconds)   POCT I-STAT 3, BLOOD GAS (G3+)     Status: Abnormal   Collection Time   03/03/12  3:03 PM      Component Value Range Comment   pH,  Arterial 7.280 (*) 7.350 - 7.400     pCO2 arterial 58.4 (*) 35.0 - 45.0 (mmHg)    pO2, Arterial 73.0 (*) 80.0 - 100.0 (mmHg)    Bicarbonate 27.5 (*) 20.0 - 24.0 (mEq/L)    TCO2 29  0 - 100 (mmol/L)    O2 Saturation 92.0      Patient temperature 36.8 C      Collection site ARTERIAL LINE      Drawn by Operator      Sample type ARTERIAL      Comment NOTIFIED PHYSICIAN     POCT I-STAT 4, (NA,K, GLUC, HGB,HCT)     Status: Abnormal   Collection Time   03/03/12  3:08 PM      Component Value Range Comment   Sodium 144  135 - 145 (mEq/L)    Potassium 3.4 (*) 3.5 - 5.1 (mEq/L)    Glucose, Bld 111 (*) 70 - 99 (mg/dL)    HCT 16.1 (*) 09.6 - 46.0 (%)    Hemoglobin 10.2 (*) 12.0 - 15.0 (g/dL)   GLUCOSE, CAPILLARY     Status: Normal   Collection Time    03/03/12  4:07 PM      Component Value Range Comment   Glucose-Capillary 80  70 - 99 (mg/dL)   GLUCOSE, CAPILLARY     Status: Normal   Collection Time   03/03/12  5:16 PM      Component Value Range Comment   Glucose-Capillary 90  70 - 99 (mg/dL)   GLUCOSE, CAPILLARY     Status: Abnormal   Collection Time   03/03/12  6:02 PM      Component Value Range Comment   Glucose-Capillary 108 (*) 70 - 99 (mg/dL)   CBC     Status: Abnormal   Collection Time   03/03/12  8:55 PM      Component Value Range Comment   WBC 13.9 (*) 4.0 - 10.5 (K/uL)    RBC 3.03 (*) 3.87 - 5.11 (MIL/uL)    Hemoglobin 8.9 (*) 12.0 - 15.0 (g/dL)    HCT 04.5 (*) 40.9 - 46.0 (%)    MCV 94.1  78.0 - 100.0 (fL)    MCH 29.4  26.0 - 34.0 (pg)    MCHC 31.2  30.0 - 36.0 (g/dL)    RDW 81.1  91.4 - 78.2 (%)    Platelets 122 (*) 150 - 400 (K/uL) CONSISTENT WITH PREVIOUS RESULT  CREATININE, SERUM     Status: Abnormal   Collection Time   03/03/12  8:55 PM      Component Value Range Comment   Creatinine, Ser 0.46 (*) 0.50 - 1.10 (mg/dL)    GFR calc non Af Amer >90  >90 (mL/min)    GFR calc Af Amer >90  >90 (mL/min)   MAGNESIUM     Status: Abnormal   Collection Time   03/03/12  8:55 PM      Component Value Range Comment   Magnesium 3.2 (*) 1.5 - 2.5 (mg/dL)   POCT I-STAT 3, BLOOD GAS (G3+)     Status: Abnormal   Collection Time   03/03/12  8:55 PM      Component Value Range Comment   pH, Arterial 7.255 (*) 7.350 - 7.400     pCO2 arterial 66.3 (*) 35.0 - 45.0 (mmHg)    pO2, Arterial 81.0  80.0 - 100.0 (mmHg)    Bicarbonate 29.4 (*) 20.0 - 24.0 (mEq/L)    TCO2 31  0 - 100 (  mmol/L)    O2 Saturation 93.0      Acid-Base Excess 1.0  0.0 - 2.0 (mmol/L)    Patient temperature 37.3 C      Collection site RADIAL, ALLEN'S TEST ACCEPTABLE      Drawn by Nurse      Sample type ARTERIAL      Comment NOTIFIED PHYSICIAN     GLUCOSE, CAPILLARY     Status: Abnormal   Collection Time   03/03/12 10:16 PM      Component Value Range  Comment   Glucose-Capillary 165 (*) 70 - 99 (mg/dL)   POCT I-STAT 3, BLOOD GAS (G3+)     Status: Abnormal   Collection Time   03/03/12 10:19 PM      Component Value Range Comment   pH, Arterial 7.290 (*) 7.350 - 7.400     pCO2 arterial 56.8 (*) 35.0 - 45.0 (mmHg)    pO2, Arterial 85.0  80.0 - 100.0 (mmHg)    Bicarbonate 27.1 (*) 20.0 - 24.0 (mEq/L)    TCO2 29  0 - 100 (mmol/L)    O2 Saturation 95.0      Patient temperature 37.5 C      Collection site RADIAL, ALLEN'S TEST ACCEPTABLE      Drawn by Nurse      Sample type ARTERIAL     GLUCOSE, CAPILLARY     Status: Abnormal   Collection Time   03/03/12 11:43 PM      Component Value Range Comment   Glucose-Capillary 132 (*) 70 - 99 (mg/dL)   GLUCOSE, CAPILLARY     Status: Abnormal   Collection Time   03/04/12  3:28 AM      Component Value Range Comment   Glucose-Capillary 124 (*) 70 - 99 (mg/dL)   CBC     Status: Abnormal   Collection Time   03/04/12  3:30 AM      Component Value Range Comment   WBC 11.8 (*) 4.0 - 10.5 (K/uL)    RBC 2.92 (*) 3.87 - 5.11 (MIL/uL)    Hemoglobin 8.8 (*) 12.0 - 15.0 (g/dL)    HCT 25.9 (*) 56.3 - 46.0 (%)    MCV 92.8  78.0 - 100.0 (fL)    MCH 30.1  26.0 - 34.0 (pg)    MCHC 32.5  30.0 - 36.0 (g/dL)    RDW 87.5  64.3 - 32.9 (%)    Platelets 108 (*) 150 - 400 (K/uL) PLATELET COUNT CONFIRMED BY SMEAR  BASIC METABOLIC PANEL     Status: Abnormal   Collection Time   03/04/12  3:30 AM      Component Value Range Comment   Sodium 140  135 - 145 (mEq/L)    Potassium 4.7  3.5 - 5.1 (mEq/L)    Chloride 108  96 - 112 (mEq/L)    CO2 26  19 - 32 (mEq/L)    Glucose, Bld 129 (*) 70 - 99 (mg/dL)    BUN 12  6 - 23 (mg/dL)    Creatinine, Ser 5.18 (*) 0.50 - 1.10 (mg/dL)    Calcium 7.7 (*) 8.4 - 10.5 (mg/dL)    GFR calc non Af Amer >90  >90 (mL/min)    GFR calc Af Amer >90  >90 (mL/min)   MAGNESIUM     Status: Normal   Collection Time   03/04/12  3:30 AM      Component Value Range Comment   Magnesium 2.5  1.5 -  2.5 (mg/dL)  POCT I-STAT 3, BLOOD GAS (G3+)     Status: Abnormal   Collection Time   03/04/12  5:12 AM      Component Value Range Comment   pH, Arterial 7.377  7.350 - 7.400     pCO2 arterial 46.7 (*) 35.0 - 45.0 (mmHg)    pO2, Arterial 82.0  80.0 - 100.0 (mmHg)    Bicarbonate 27.2 (*) 20.0 - 24.0 (mEq/L)    TCO2 28  0 - 100 (mmol/L)    O2 Saturation 95.0      Acid-Base Excess 2.0  0.0 - 2.0 (mmol/L)    Patient temperature 38.1 C      Collection site RADIAL, ALLEN'S TEST ACCEPTABLE      Sample type ARTERIAL     POCT I-STAT 3, BLOOD GAS (G3+)     Status: Abnormal   Collection Time   03/04/12  7:15 AM      Component Value Range Comment   pH, Arterial 7.353  7.350 - 7.400     pCO2 arterial 48.0 (*) 35.0 - 45.0 (mmHg)    pO2, Arterial 109.0 (*) 80.0 - 100.0 (mmHg)    Bicarbonate 26.5 (*) 20.0 - 24.0 (mEq/L)    TCO2 28  0 - 100 (mmol/L)    O2 Saturation 98.0      Acid-Base Excess 1.0  0.0 - 2.0 (mmol/L)    Patient temperature 38.0 C      Collection site RADIAL, ALLEN'S TEST ACCEPTABLE      Sample type ARTERIAL     POCT I-STAT 3, BLOOD GAS (G3+)     Status: Abnormal   Collection Time   03/04/12  7:34 AM      Component Value Range Comment   pH, Arterial 7.394  7.350 - 7.400     pCO2 arterial 44.2  35.0 - 45.0 (mmHg)    pO2, Arterial 108.0 (*) 80.0 - 100.0 (mmHg)    Bicarbonate 26.8 (*) 20.0 - 24.0 (mEq/L)    TCO2 28  0 - 100 (mmol/L)    O2 Saturation 98.0      Acid-Base Excess 2.0  0.0 - 2.0 (mmol/L)    Patient temperature 100.4 F      Collection site ARTERIAL LINE      Drawn by Operator      Sample type ARTERIAL     GLUCOSE, CAPILLARY     Status: Abnormal   Collection Time   03/04/12  7:52 AM      Component Value Range Comment   Glucose-Capillary 119 (*) 70 - 99 (mg/dL)    Comment 1 Documented in Chart      Comment 2 Notify RN     GLUCOSE, CAPILLARY     Status: Abnormal   Collection Time   03/04/12 12:06 PM      Component Value Range Comment   Glucose-Capillary 132 (*) 70  - 99 (mg/dL)    Comment 1 Documented in Chart      Comment 2 Notify RN     GLUCOSE, CAPILLARY     Status: Abnormal   Collection Time   03/04/12  3:33 PM      Component Value Range Comment   Glucose-Capillary 139 (*) 70 - 99 (mg/dL)   MAGNESIUM     Status: Normal   Collection Time   03/04/12  4:10 PM      Component Value Range Comment   Magnesium 2.3  1.5 - 2.5 (mg/dL)   CBC     Status: Abnormal  Collection Time   03/04/12  4:10 PM      Component Value Range Comment   WBC 11.7 (*) 4.0 - 10.5 (K/uL)    RBC 2.86 (*) 3.87 - 5.11 (MIL/uL)    Hemoglobin 8.5 (*) 12.0 - 15.0 (g/dL)    HCT 16.1 (*) 09.6 - 46.0 (%)    MCV 94.1  78.0 - 100.0 (fL)    MCH 29.7  26.0 - 34.0 (pg)    MCHC 31.6  30.0 - 36.0 (g/dL)    RDW 04.5  40.9 - 81.1 (%)    Platelets 87 (*) 150 - 400 (K/uL) CONSISTENT WITH PREVIOUS RESULT  CREATININE, SERUM     Status: Abnormal   Collection Time   03/04/12  4:10 PM      Component Value Range Comment   Creatinine, Ser 0.41 (*) 0.50 - 1.10 (mg/dL)    GFR calc non Af Amer >90  >90 (mL/min)    GFR calc Af Amer >90  >90 (mL/min)   BUN     Status: Normal   Collection Time   03/04/12  4:10 PM      Component Value Range Comment   BUN 14  6 - 23 (mg/dL)   POCT I-STAT, CHEM 8     Status: Abnormal   Collection Time   03/04/12  4:17 PM      Component Value Range Comment   Sodium 141  135 - 145 (mEq/L)    Potassium 4.1  3.5 - 5.1 (mEq/L)    Chloride 102  96 - 112 (mEq/L)    BUN 15  6 - 23 (mg/dL)    Creatinine, Ser 9.14  0.50 - 1.10 (mg/dL)    Glucose, Bld 782 (*) 70 - 99 (mg/dL)    Calcium, Ion 9.56  1.12 - 1.32 (mmol/L)    TCO2 30  0 - 100 (mmol/L)    Hemoglobin 8.8 (*) 12.0 - 15.0 (g/dL)    HCT 21.3 (*) 08.6 - 46.0 (%)   GLUCOSE, CAPILLARY     Status: Abnormal   Collection Time   03/04/12  7:41 PM      Component Value Range Comment   Glucose-Capillary 144 (*) 70 - 99 (mg/dL)   GLUCOSE, CAPILLARY     Status: Abnormal   Collection Time   03/04/12 11:23 PM      Component  Value Range Comment   Glucose-Capillary 145 (*) 70 - 99 (mg/dL)   GLUCOSE, CAPILLARY     Status: Abnormal   Collection Time   03/05/12  3:39 AM      Component Value Range Comment   Glucose-Capillary 129 (*) 70 - 99 (mg/dL)   BASIC METABOLIC PANEL     Status: Abnormal   Collection Time   03/05/12  4:30 AM      Component Value Range Comment   Sodium 137  135 - 145 (mEq/L)    Potassium 4.4  3.5 - 5.1 (mEq/L)    Chloride 101  96 - 112 (mEq/L)    CO2 31  19 - 32 (mEq/L)    Glucose, Bld 127 (*) 70 - 99 (mg/dL)    BUN 17  6 - 23 (mg/dL)    Creatinine, Ser 5.78 (*) 0.50 - 1.10 (mg/dL)    Calcium 8.2 (*) 8.4 - 10.5 (mg/dL)    GFR calc non Af Amer >90  >90 (mL/min)    GFR calc Af Amer >90  >90 (mL/min)   CBC     Status: Abnormal  Collection Time   03/05/12  4:30 AM      Component Value Range Comment   WBC 11.9 (*) 4.0 - 10.5 (K/uL)    RBC 3.04 (*) 3.87 - 5.11 (MIL/uL)    Hemoglobin 9.0 (*) 12.0 - 15.0 (g/dL)    HCT 16.1 (*) 09.6 - 46.0 (%)    MCV 94.1  78.0 - 100.0 (fL)    MCH 29.6  26.0 - 34.0 (pg)    MCHC 31.5  30.0 - 36.0 (g/dL)    RDW 04.5  40.9 - 81.1 (%)    Platelets 77 (*) 150 - 400 (K/uL) CONSISTENT WITH PREVIOUS RESULT    Imaging: Imaging results have been reviewed  Assessment/Plan:   1. Principal Problem: 2.  *Exertional dyspnea 3. Active Problems: 4.  Anxiety disorder 5.  CAD, ? Stent at [facility] in 2001, cath 03/02/12 with 95% Lt. Main 6.  GERD (gastroesophageal reflux disease) 7.  NSTEMI, Troponin 0.83 on admission to [facility] 8.  COPD (chronic obstructive pulmonary disease) 9.  Smoker, 2ppd 10.  Obesity 11.  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   Time Spent Directly with Patient:  20 minutes  Length of Stay:  LOS: 3 days   POD #2 CABG X2 for LM disease. [place] to LAD/SVG to OM. Looks great. Exam benign. Labs OK. CXR looks wet. Continue to diurese. IS. [facility]. Tx tele per [facility].  Runell Gess 03/05/2012, 8:39 AM

## 2012-03-06 ENCOUNTER — Inpatient Hospital Stay (HOSPITAL_COMMUNITY): Payer: Medicaid Other

## 2012-03-06 LAB — COMPREHENSIVE METABOLIC PANEL
ALT: 17 U/L (ref 0–35)
AST: 36 U/L (ref 0–37)
Albumin: 2.9 g/dL — ABNORMAL LOW (ref 3.5–5.2)
Alkaline Phosphatase: 78 U/L (ref 39–117)
BUN: 20 mg/dL (ref 6–23)
CO2: 39 mEq/L — ABNORMAL HIGH (ref 19–32)
Calcium: 8.7 mg/dL (ref 8.4–10.5)
Chloride: 90 mEq/L — ABNORMAL LOW (ref 96–112)
Creatinine, Ser: 0.48 mg/dL — ABNORMAL LOW (ref 0.50–1.10)
GFR calc Af Amer: >90 mL/min (ref >90)
GFR calc non Af Amer: >90 mL/min (ref >90)
Glucose, Bld: 118 mg/dL — ABNORMAL HIGH (ref 70–99)
Potassium: 4.1 mEq/L (ref 3.5–5.1)
Sodium: 132 mEq/L — ABNORMAL LOW (ref 135–145)
Total Bilirubin: 0.5 mg/dL (ref 0.3–1.2)
Total Protein: 6.1 g/dL (ref 6.0–8.3)

## 2012-03-06 LAB — GLUCOSE, CAPILLARY
Glucose-Capillary: 107 mg/dL — ABNORMAL HIGH (ref 70–99)
Glucose-Capillary: 111 mg/dL — ABNORMAL HIGH (ref 70–99)

## 2012-03-06 MED ORDER — FUROSEMIDE 10 MG/ML IJ SOLN
40.0000 mg | Freq: Two times a day (BID) | INTRAMUSCULAR | Status: DC
  Administered 2012-03-06 – 2012-03-07 (×4): 40 mg via INTRAVENOUS
  Filled 2012-03-06 (×6): qty 4

## 2012-03-06 MED ORDER — METOCLOPRAMIDE HCL 5 MG/ML IJ SOLN
10.0000 mg | Freq: Four times a day (QID) | INTRAMUSCULAR | Status: AC
  Administered 2012-03-06 – 2012-03-07 (×4): 10 mg via INTRAVENOUS
  Filled 2012-03-06 (×7): qty 2

## 2012-03-06 MED ORDER — MOXIFLOXACIN HCL 400 MG PO TABS
400.0000 mg | ORAL_TABLET | Freq: Every day | ORAL | Status: DC
  Administered 2012-03-06 – 2012-03-09 (×4): 400 mg via ORAL
  Filled 2012-03-06 (×5): qty 1

## 2012-03-06 NOTE — Progress Notes (Signed)
Subjective:  SOB  Objective:  Temp:  [96.6 F (35.9 C)-98.4 F (36.9 C)] 97.5 F (36.4 C) (05/26 0724) Pulse Rate:  [90-103] 92  (05/26 0700) Resp:  [14-20] 14  (05/26 0700) BP: (84-111)/(41-61) 94/41 mmHg (05/26 0700) SpO2:  [94 %-100 %] 98 % (05/26 0807) Weight:  [91.4 kg (201 lb 8 oz)] 91.4 kg (201 lb 8 oz) (05/26 0600) Weight change: -2.5 kg (-5 lb 8.2 oz)  Intake/Output from previous day: 05/25 0701 - 05/26 0700 In: 470 [P.O.:360; I.V.:60; IV Piggyback:50] Out: 3495 [Urine:3495]  Intake/Output from this shift:    Physical Exam: General appearance: alert, cooperative and mild distress Neck: no adenopathy, no carotid bruit, no JVD, supple, symmetrical, trachea midline and thyroid not enlarged, symmetric, no tenderness/mass/nodules Lungs: Scattered wheezes, decreased BS at bases left >> right Heart: regular rate and rhythm, S1, S2 normal, no murmur, click, rub or gallop Extremities: 1+ edema bilaterally  Lab Results: Results for orders placed during the hospital encounter of 03/02/12 (from the past 48 hour(s))  GLUCOSE, CAPILLARY     Status: Abnormal   Collection Time   03/04/12 12:06 PM      Component Value Range Comment   Glucose-Capillary 132 (*) 70 - 99 (mg/dL)    Comment 1 Documented in Chart      Comment 2 Notify RN     GLUCOSE, CAPILLARY     Status: Abnormal   Collection Time   03/04/12  3:33 PM      Component Value Range Comment   Glucose-Capillary 139 (*) 70 - 99 (mg/dL)   MAGNESIUM     Status: Normal   Collection Time   03/04/12  4:10 PM      Component Value Range Comment   Magnesium 2.3  1.5 - 2.5 (mg/dL)   CBC     Status: Abnormal   Collection Time   03/04/12  4:10 PM      Component Value Range Comment   WBC 11.7 (*) 4.0 - 10.5 (K/uL)    RBC 2.86 (*) 3.87 - 5.11 (MIL/uL)    Hemoglobin 8.5 (*) 12.0 - 15.0 (g/dL)    HCT 40.9 (*) 81.1 - 46.0 (%)    MCV 94.1  78.0 - 100.0 (fL)    MCH 29.7  26.0 - 34.0 (pg)    MCHC 31.6  30.0 - 36.0 (g/dL)    RDW 91.4   78.2 - 95.6 (%)    Platelets 87 (*) 150 - 400 (K/uL) CONSISTENT WITH PREVIOUS RESULT  CREATININE, SERUM     Status: Abnormal   Collection Time   03/04/12  4:10 PM      Component Value Range Comment   Creatinine, Ser 0.41 (*) 0.50 - 1.10 (mg/dL)    GFR calc non Af Amer >90  >90 (mL/min)    GFR calc Af Amer >90  >90 (mL/min)   BUN     Status: Normal   Collection Time   03/04/12  4:10 PM      Component Value Range Comment   BUN 14  6 - 23 (mg/dL)   POCT I-STAT, CHEM 8     Status: Abnormal   Collection Time   03/04/12  4:17 PM      Component Value Range Comment   Sodium 141  135 - 145 (mEq/L)    Potassium 4.1  3.5 - 5.1 (mEq/L)    Chloride 102  96 - 112 (mEq/L)    BUN 15  6 - 23 (mg/dL)    Creatinine,  Ser 0.50  0.50 - 1.10 (mg/dL)    Glucose, Bld 409 (*) 70 - 99 (mg/dL)    Calcium, Ion 8.11  1.12 - 1.32 (mmol/L)    TCO2 30  0 - 100 (mmol/L)    Hemoglobin 8.8 (*) 12.0 - 15.0 (g/dL)    HCT 91.4 (*) 78.2 - 46.0 (%)   GLUCOSE, CAPILLARY     Status: Abnormal   Collection Time   03/04/12  7:41 PM      Component Value Range Comment   Glucose-Capillary 144 (*) 70 - 99 (mg/dL)   GLUCOSE, CAPILLARY     Status: Abnormal   Collection Time   03/04/12 11:23 PM      Component Value Range Comment   Glucose-Capillary 145 (*) 70 - 99 (mg/dL)   GLUCOSE, CAPILLARY     Status: Abnormal   Collection Time   03/05/12  3:39 AM      Component Value Range Comment   Glucose-Capillary 129 (*) 70 - 99 (mg/dL)   BASIC METABOLIC PANEL     Status: Abnormal   Collection Time   03/05/12  4:30 AM      Component Value Range Comment   Sodium 137  135 - 145 (mEq/L)    Potassium 4.4  3.5 - 5.1 (mEq/L)    Chloride 101  96 - 112 (mEq/L)    CO2 31  19 - 32 (mEq/L)    Glucose, Bld 127 (*) 70 - 99 (mg/dL)    BUN 17  6 - 23 (mg/dL)    Creatinine, Ser 9.56 (*) 0.50 - 1.10 (mg/dL)    Calcium 8.2 (*) 8.4 - 10.5 (mg/dL)    GFR calc non Af Amer >90  >90 (mL/min)    GFR calc Af Amer >90  >90 (mL/min)   CBC     Status:  Abnormal   Collection Time   03/05/12  4:30 AM      Component Value Range Comment   WBC 11.9 (*) 4.0 - 10.5 (K/uL)    RBC 3.04 (*) 3.87 - 5.11 (MIL/uL)    Hemoglobin 9.0 (*) 12.0 - 15.0 (g/dL)    HCT 21.3 (*) 08.6 - 46.0 (%)    MCV 94.1  78.0 - 100.0 (fL)    MCH 29.6  26.0 - 34.0 (pg)    MCHC 31.5  30.0 - 36.0 (g/dL)    RDW 57.8  46.9 - 62.9 (%)    Platelets 77 (*) 150 - 400 (K/uL) CONSISTENT WITH PREVIOUS RESULT  GLUCOSE, CAPILLARY     Status: Abnormal   Collection Time   03/05/12  7:25 AM      Component Value Range Comment   Glucose-Capillary 111 (*) 70 - 99 (mg/dL)    Comment 1 Notify RN     GLUCOSE, CAPILLARY     Status: Abnormal   Collection Time   03/05/12 11:33 AM      Component Value Range Comment   Glucose-Capillary 132 (*) 70 - 99 (mg/dL)    Comment 1 Notify RN     URINALYSIS, ROUTINE W REFLEX MICROSCOPIC     Status: Normal   Collection Time   03/05/12  3:10 PM      Component Value Range Comment   Color, Urine YELLOW  YELLOW     APPearance CLEAR  CLEAR     Specific Gravity, Urine 1.017  1.005 - 1.030     pH 5.5  5.0 - 8.0     Glucose, UA NEGATIVE  NEGATIVE (mg/dL)  Hgb urine dipstick NEGATIVE  NEGATIVE     Bilirubin Urine NEGATIVE  NEGATIVE     Ketones, ur NEGATIVE  NEGATIVE (mg/dL)    Protein, ur NEGATIVE  NEGATIVE (mg/dL)    Urobilinogen, UA 0.2  0.0 - 1.0 (mg/dL)    Nitrite NEGATIVE  NEGATIVE     Leukocytes, UA NEGATIVE  NEGATIVE  MICROSCOPIC NOT DONE ON URINES WITH NEGATIVE PROTEIN, BLOOD, LEUKOCYTES, NITRITE, OR GLUCOSE <1000 mg/dL.  GLUCOSE, CAPILLARY     Status: Abnormal   Collection Time   03/05/12  3:59 PM      Component Value Range Comment   Glucose-Capillary 115 (*) 70 - 99 (mg/dL)    Comment 1 Notify RN     GLUCOSE, CAPILLARY     Status: Abnormal   Collection Time   03/05/12  7:54 PM      Component Value Range Comment   Glucose-Capillary 114 (*) 70 - 99 (mg/dL)   GLUCOSE, CAPILLARY     Status: Abnormal   Collection Time   03/05/12 11:21 PM       Component Value Range Comment   Glucose-Capillary 103 (*) 70 - 99 (mg/dL)   COMPREHENSIVE METABOLIC PANEL     Status: Abnormal   Collection Time   03/06/12  4:24 AM      Component Value Range Comment   Sodium 132 (*) 135 - 145 (mEq/L)    Potassium 4.1  3.5 - 5.1 (mEq/L)    Chloride 90 (*) 96 - 112 (mEq/L) DELTA CHECK NOTED   CO2 39 (*) 19 - 32 (mEq/L)    Glucose, Bld 118 (*) 70 - 99 (mg/dL)    BUN 20  6 - 23 (mg/dL)    Creatinine, Ser 1.61 (*) 0.50 - 1.10 (mg/dL)    Calcium 8.7  8.4 - 10.5 (mg/dL)    Total Protein 6.1  6.0 - 8.3 (g/dL)    Albumin 2.9 (*) 3.5 - 5.2 (g/dL)    AST 36  0 - 37 (U/L)    ALT 17  0 - 35 (U/L)    Alkaline Phosphatase 78  39 - 117 (U/L)    Total Bilirubin 0.5  0.3 - 1.2 (mg/dL)    GFR calc non Af Amer >90  >90 (mL/min)    GFR calc Af Amer >90  >90 (mL/min)   GLUCOSE, CAPILLARY     Status: Abnormal   Collection Time   03/06/12  4:35 AM      Component Value Range Comment   Glucose-Capillary 109 (*) 70 - 99 (mg/dL)   GLUCOSE, CAPILLARY     Status: Abnormal   Collection Time   03/06/12  7:27 AM      Component Value Range Comment   Glucose-Capillary 109 (*) 70 - 99 (mg/dL)     Imaging: Imaging results have been reviewed  Assessment/Plan:   1. Principal Problem: 2.  *Exertional dyspnea 3. Active Problems: 4.  Anxiety disorder 5.  CAD, ? Stent at [facility] in 2001, cath 03/02/12 with 95% Lt. Main 6.  GERD (gastroesophageal reflux disease) 7.  NSTEMI, Troponin 0.83 on admission to [facility] 8.  COPD (chronic obstructive pulmonary disease) 9.  Smoker, 2ppd 10.  Obesity 11.  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   Time Spent Directly with Patient:  20 minutes  Length of Stay:  LOS: 4 days   POD #3 CABG X 2. VSS. Labs OK. CXR notable for L >> R pleural effusions. Off drips.  May need left thoracentesis vs CT. Would increase diuretics, IS etyc.Marland Kitchen Remainder of treatment per [facility].  Runell Gess 03/06/2012, 8:21 AM

## 2012-03-06 NOTE — Progress Notes (Signed)
Patient examined and record reviewed.Hemodynamics stable,labs satisfactory.Patient had stable day.Continue current care.  Min drainage lower sternum, NSR, CBGs controlled, walked in hall [name],Jacques Fife 03/06/2012

## 2012-03-06 NOTE — Progress Notes (Signed)
Patient had approx 2 min run of Atrial Fib RVR with rates fluctuating between 120-170 bpm with noted sinus beats throughout. BP, RR, and POx unchanged during episode and patient asymptomatic. She converted back to NSR 90s with PACs. BP <90 systolic and is unable to tolerate her beta-blocker at this time. Will continue to monitor for any other arrhythmias or changes in her hemodynamics.

## 2012-03-06 NOTE — Progress Notes (Signed)
3 Days Post-Op Procedure(s) (LRB): CORONARY ARTERY BYPASS GRAFTING (CABG) (N/A)                       [address]            [place] [address]          [phone]      Subjective: S/P CABG for .95 L main Preop COPD,active smoker,obese BMI 40, DM  Post op L base atelect/effusion, lower sternal drainage Objective: Vital signs in last 24 hours: Temp:  [96.6 F (35.9 C)-98.4 F (36.9 C)] 97.5 F (36.4 C) (05/26 0724) Pulse Rate:  [67-103] 67  (05/26 0900) Cardiac Rhythm:  [-] Normal sinus rhythm (05/26 0800) Resp:  [14-20] 20  (05/26 0900) BP: (84-111)/(41-61) 98/51 mmHg (05/26 0900) SpO2:  [94 %-100 %] 99 % (05/26 0900) Weight:  [201 lb 8 oz (91.4 kg)] 201 lb 8 oz (91.4 kg) (05/26 0600)  Hemodynamic parameters for last 24 hours:  NSR, afebrile  Intake/Output from previous day: 05/25 0701 - 05/26 0700 In: 470 [P.O.:360; I.V.:60; IV Piggyback:50] Out: 3495 [Urine:3495] Intake/Output this shift: Total I/O In: 60 [P.O.:60] Out: 125 [Urine:125] EXAM Dec BS @ L base Min lower sternal drainage  Lab Results:  Basename 03/05/12 0430 03/04/12 1617 03/04/12 1610  WBC 11.9* -- 11.7*  HGB 9.0* 8.8* --  HCT 28.6* 26.0* --  PLT 77* -- 87*   BMET:  Basename 03/06/12 0424 03/05/12 0430  NA 132* 137  K 4.1 4.4  CL 90* 101  CO2 39* 31  GLUCOSE 118* 127*  BUN 20 17  CREATININE 0.48* 0.40*  CALCIUM 8.7 8.2*    PT/INR:  Basename 03/03/12 1500  LABPROT 18.1*  INR 1.47   ABG    Component Value Date/Time   PHART 7.394 03/04/2012 0734   HCO3 26.8* 03/04/2012 0734   TCO2 30 03/04/2012 1617   ACIDBASEDEF 1.0 03/03/2012 1344   O2SAT 98.0 03/04/2012 0734   CBG (last 3)   Basename 03/06/12 0727 03/06/12 0435 03/05/12 2321  GLUCAP 109* 109* 103*    Assessment/Plan: S/P Procedure(s) (LRB): CORONARY ARTERY BYPASS GRAFTING (CABG) (N/A) PLAN- cont diuresis, dressing chg, SSI,start po Avelox             Follow L effusion, low plts 77k   LOS: 4 days     [name],Kekai Geter 03/06/2012

## 2012-03-07 ENCOUNTER — Encounter (HOSPITAL_COMMUNITY): Payer: Self-pay | Admitting: *Deleted

## 2012-03-07 ENCOUNTER — Inpatient Hospital Stay (HOSPITAL_COMMUNITY): Payer: Medicaid Other

## 2012-03-07 LAB — CBC
HCT: 27.7 % — ABNORMAL LOW (ref 36.0–46.0)
Hemoglobin: 8.8 g/dL — ABNORMAL LOW (ref 12.0–15.0)
MCH: 29.5 pg (ref 26.0–34.0)
MCHC: 31.8 g/dL (ref 30.0–36.0)
MCV: 93 fL (ref 78.0–100.0)
Platelets: 146 10*3/uL — ABNORMAL LOW (ref 150–400)
RBC: 2.98 MIL/uL — ABNORMAL LOW (ref 3.87–5.11)
RDW: 14.6 % (ref 11.5–15.5)
WBC: 13.9 10*3/uL — ABNORMAL HIGH (ref 4.0–10.5)

## 2012-03-07 LAB — TYPE AND SCREEN
ABO/RH(D): A POS
Antibody Screen: NEGATIVE
Unit division: 0
Unit division: 0

## 2012-03-07 LAB — GLUCOSE, CAPILLARY
Glucose-Capillary: 126 mg/dL — ABNORMAL HIGH (ref 70–99)
Glucose-Capillary: 84 mg/dL (ref 70–99)

## 2012-03-07 LAB — BASIC METABOLIC PANEL
BUN: 26 mg/dL — ABNORMAL HIGH (ref 6–23)
CO2: 40 mEq/L (ref 19–32)
Calcium: 8.6 mg/dL (ref 8.4–10.5)
Chloride: 89 mEq/L — ABNORMAL LOW (ref 96–112)
Creatinine, Ser: 0.53 mg/dL (ref 0.50–1.10)
GFR calc Af Amer: >90 mL/min (ref >90)
GFR calc non Af Amer: >90 mL/min (ref >90)
Glucose, Bld: 102 mg/dL — ABNORMAL HIGH (ref 70–99)
Potassium: 3.9 mEq/L (ref 3.5–5.1)
Sodium: 134 mEq/L — ABNORMAL LOW (ref 135–145)

## 2012-03-07 MED ORDER — AMIODARONE HCL 200 MG PO TABS
400.0000 mg | ORAL_TABLET | Freq: Two times a day (BID) | ORAL | Status: AC
  Administered 2012-03-07 – 2012-03-13 (×14): 400 mg via ORAL
  Filled 2012-03-07 (×15): qty 2

## 2012-03-07 MED ORDER — VANCOMYCIN HCL 1000 MG IV SOLR: 750.0000 mg | Freq: Two times a day (BID) | INTRAVENOUS | Status: DC

## 2012-03-07 MED ORDER — AMIODARONE HCL IN DEXTROSE 360-4.14 MG/200ML-% IV SOLN
INTRAVENOUS | Status: AC
  Filled 2012-03-07: qty 200

## 2012-03-07 MED ORDER — SODIUM CHLORIDE 0.9 % IJ SOLN
10.0000 mL | Freq: Two times a day (BID) | INTRAMUSCULAR | Status: DC
  Administered 2012-03-07 (×2): 20 mL
  Administered 2012-03-08 – 2012-03-10 (×2): 10 mL

## 2012-03-07 MED ORDER — AMIODARONE HCL 200 MG PO TABS: 200.0000 mg | ORAL_TABLET | Freq: Every day | ORAL | Status: DC

## 2012-03-07 MED ORDER — SODIUM CHLORIDE 0.9 % IJ SOLN
10.0000 mL | INTRAMUSCULAR | Status: DC | PRN
  Administered 2012-03-09 (×2): 20 mL
  Administered 2012-03-10: 10 mL
  Administered 2012-03-10: 20 mL
  Administered 2012-03-11 – 2012-03-15 (×6): 10 mL

## 2012-03-07 MED ORDER — AMIODARONE HCL 200 MG PO TABS: 200.0000 mg | ORAL_TABLET | Freq: Two times a day (BID) | ORAL | Status: DC

## 2012-03-07 MED ORDER — AMIODARONE LOAD VIA INFUSION
150.0000 mg | Freq: Once | INTRAVENOUS | Status: AC
  Administered 2012-03-07: 150 mg via INTRAVENOUS
  Filled 2012-03-07: qty 83.34

## 2012-03-07 MED ORDER — VANCOMYCIN HCL IN DEXTROSE 1-5 GM/200ML-% IV SOLN
1000.0000 mg | Freq: Two times a day (BID) | INTRAVENOUS | Status: DC
  Administered 2012-03-07 – 2012-03-10 (×7): 1000 mg via INTRAVENOUS
  Filled 2012-03-07 (×9): qty 200

## 2012-03-07 MED FILL — Potassium Chloride Inj 2 mEq/ML: INTRAVENOUS | Qty: 40 | Status: AC

## 2012-03-07 MED FILL — Magnesium Sulfate Inj 50%: INTRAMUSCULAR | Qty: 10 | Status: AC

## 2012-03-07 NOTE — Progress Notes (Signed)
Patient ID: Victoria Bell, female   DOB: 07-Feb-1961, 51 y.o.   MRN: [id number] TCTS DAILY PROGRESS NOTE                   [address]            [place] [address]          [phone]      4 Days Post-Op Procedure(s) (LRB): CORONARY ARTERY BYPASS GRAFTING (CABG) (N/A)  Total Length of Stay:  LOS: 5 days   Subjective: brief episode of a fib last night brief now holding sinus Objective: Vital signs in last 24 hours: Temp:  [97.6 F (36.4 C)-98.2 F (36.8 C)] 97.6 F (36.4 C) (05/27 0759) Pulse Rate:  [67-99] 89  (05/27 0815) Cardiac Rhythm:  [-] Normal sinus rhythm (05/27 0815) Resp:  [16-30] 18  (05/27 0815) BP: (84-108)/(46-60) 98/60 mmHg (05/27 0800) SpO2:  [92 %-100 %] 96 % (05/27 0815) Weight:  [198 lb 3.1 oz (89.9 kg)] 198 lb 3.1 oz (89.9 kg) (05/27 0428)  Filed Weights   03/05/12 0600 03/06/12 0600 03/07/12 0428  Weight: 207 lb 0.2 oz (93.9 kg) 201 lb 8 oz (91.4 kg) 198 lb 3.1 oz (89.9 kg)    Weight change: -3 lb 4.9 oz (-1.5 kg)   Hemodynamic parameters for last 24 hours:    Intake/Output from previous day: 05/26 0701 - 05/27 0700 In: 862 [P.O.:840; IV Piggyback:22] Out: 2535 [Urine:2535]  Intake/Output this shift: Total I/O In: 60 [P.O.:60] Out: -   Current Meds: Scheduled Meds:   . acetaminophen  1,000 mg Oral Q6H   Or  . acetaminophen (TYLENOL) oral liquid 160 mg/5 mL  975 mg Per Tube Q6H  . ALPRAZolam  1.5 mg Oral BID  . aspirin EC  325 mg Oral Daily   Or  . aspirin  324 mg Per Tube Daily  . bisacodyl  10 mg Oral Daily   Or  . bisacodyl  10 mg Rectal Daily  . budesonide  0.5 mg Nebulization BID  . dextromethorphan-guaiFENesin  1 tablet Oral BID  . docusate sodium  200 mg Oral Daily  . furosemide  40 mg Intravenous BID  . insulin aspart  0-24 Units Subcutaneous Q4H  . insulin glargine  14 Units Subcutaneous BH-q7a  . levalbuterol  0.63 mg Nebulization TID BM  . metoCLOPramide (REGLAN) injection  10 mg Intravenous Q6H  .  metoprolol tartrate  12.5 mg Oral BID   Or  . metoprolol tartrate  12.5 mg Per Tube BID  . moxifloxacin  400 mg Oral q1800  . nicotine  21 mg Transdermal Daily  . pantoprazole  40 mg Oral Q1200  . simvastatin  20 mg Oral q1800  . sodium chloride  3 mL Intravenous Q12H   Continuous Infusions:   . sodium chloride 20 mL/hr (03/05/12 0704)  . sodium chloride     PRN Meds:.levalbuterol, metoprolol, morphine injection, ondansetron (ZOFRAN) IV, oxyCODONE, sodium chloride, traMADol  General appearance: alert, cooperative and appears older than stated age Neurologic: intact Heart: regular rate and rhythm, S1, S2 normal, no murmur, click, rub or gallop and normal apical impulse Lungs: clear to auscultation bilaterally and normal percussion bilaterally Abdomen: soft, non-tender; bowel sounds normal; no masses,  no organomegaly Extremities: extremities normal, atraumatic, no cyanosis or edema and Homans sign is negative, no sign of DVT Small amt separation lower wound some nonpurlent drainage on avalox   Lab Results: CBC: Basename 03/07/12 0406 03/05/12 0430  WBC  13.9* 11.9*  HGB 8.8* 9.0*  HCT 27.7* 28.6*  PLT 146* 77*   BMET:  Basename 03/07/12 0406 03/06/12 0424  NA 134* 132*  K 3.9 4.1  CL 89* 90*  CO2 40* 39*  GLUCOSE 102* 118*  BUN 26* 20  CREATININE 0.53 0.48*  CALCIUM 8.6 8.7    PT/INR: No results found for this basename: LABPROT,INR in the last 72 hours Radiology: Dg Chest 2 View  03/07/2012  *RADIOLOGY REPORT*  Clinical Data: Left-sided chest pain.  Left effusion.  CHEST - 2 VIEW  Comparison: 03/06/2012  Findings: There has been significant reduction in the moderate left pleural effusion.  Small right effusion is essentially unchanged.  Pulmonary vascularity is normal.  Evidence of recent CABG.  IMPRESSION: Significant decrease in the left effusion.  No change of the right effusion.  Original Report Authenticated By: Gwynn Burly, M.D.   Dg Chest 2 View  03/06/2012   *RADIOLOGY REPORT*  Clinical Data: Post CABG.  CHEST - 2 VIEW  Comparison: 03/05/2012  Findings: The right jugular central line has been removed.  There is a large amount of left pleural fluid with fluid at the apex. The left pleural effusion appears to be enlarging.  Evidence for a small right pleural effusion.  There is no significant pneumothorax.  Epicardial pacer wires are present.  Left cardiac silhouette is obscured by the left pleural fluid.  IMPRESSION: Large left pleural effusion.  Small right pleural effusion.  Original Report Authenticated By: Richarda Overlie, M.D.     Assessment/Plan: S/P Procedure(s) (LRB): CORONARY ARTERY BYPASS GRAFTING (CABG) (N/A) Mobilize Diuresis start po cordarone for afib episodic     Delight Ovens MD  Beeper [phone] Office [phone] 03/07/2012 8:34 AM

## 2012-03-07 NOTE — Progress Notes (Addendum)
Amiodarone Drug - Drug Interaction Consult Note  Recommendations: - Continue Zocor at 20mg  daily, consider switching to Lipitor/Crestor if higher dose needed. - Last QTc (5/24), noted Avelox day #2, consider limiting duration with concurrent Amio d/t risk of QTc prolongation. - Ongoing lasix noted, aim to keep serum K ~ 4.   Amiodarone is metabolized by the cytochrome P450 system and therefore has the potential to cause many drug interactions. Amiodarone has an average plasma half-life of 50 days (range 20 to 100 days).   There is potential for drug interactions to occur several weeks or months after stopping treatment and the onset of drug interactions may be slow after initiating amiodarone.   [x]  Statins: Increased risk of myopathy. Simvastatin- restrict dose to 20mg  daily. Other statins: counsel patients to report any muscle pain or weakness immediately.  []  Anticoagulants: Amiodarone can increase anticoagulant effect. Consider warfarin dose reduction. Patients should be monitored closely and the dose of anticoagulant altered accordingly, remembering that amiodarone levels take several weeks to stabilize.  []  Antiepileptics: Amiodarone can increase plasma concentration of phenytoin, phenytoin dose should be reduced. Note that small changes in phenytoin dose can result in large changes in phenytoin levels. Monitor patient closely and counsel on signs of toxicity.  [x]  Beta blockers: increased risk of bradycardia, AV block and myocardial depression. Sotalol - avoid concomitant use.  []   Calcium channel blockers (diltiazem and verapamil): increased risk of bradycardia, AV block and myocardial depression.  []   Cyclosporine: Amiodarone increases levels of cyclosporine. Reduced dose of cyclosporine is recommended.  []  Digoxin dose should be halved when amiodarone is started.  [x]  Diuretics: increased risk of cardiotoxicity if hypokalemia occurs.  []  Oral hypoglycemic agents  (glyburide, glipizide, glimepiride): increased risk of hypoglycemia. Patient's glucose levels should be monitored closely when initiating amiodarone therapy.   [x]  Drugs that prolong the QT interval: Concurrent therapy is contraindicated due to the increased risk of torsades de pointes; . Antibiotics: e.g. fluoroquinolones, erythromycin. . Antiarrhythmics: e.g. quinidine, procainamide, disopyramide, sotalol. . Antipsychotics: e.g. phenothiazines, haloperidol.  . Lithium, tricyclic antidepressants, and methadone.  Thank You, Imaan Padgett K. Allena Katz, PharmD, BCPS.  Clinical Pharmacist Pager [phone]. 03/07/2012 9:06 AM

## 2012-03-07 NOTE — Progress Notes (Signed)
Subjective:  Clinically improved. Less SOB  Objective:  Temp:  [97.6 F (36.4 C)-98.2 F (36.8 C)] 97.6 F (36.4 C) (05/27 0759) Pulse Rate:  [67-99] 95  (05/27 0700) Resp:  [16-30] 17  (05/27 0700) BP: (84-108)/(46-58) 106/54 mmHg (05/27 0700) SpO2:  [92 %-100 %] 100 % (05/27 0734) Weight:  [89.9 kg (198 lb 3.1 oz)] 89.9 kg (198 lb 3.1 oz) (05/27 0428) Weight change: -1.5 kg (-3 lb 4.9 oz)  Intake/Output from previous day: 05/26 0701 - 05/27 0700 In: 862 [P.O.:840; IV Piggyback:22] Out: 2535 [Urine:2535]  Intake/Output from this shift:    Physical Exam: General appearance: alert and cooperative Neck: no adenopathy, no carotid bruit, no JVD, supple, symmetrical, trachea midline and thyroid not enlarged, symmetric, no tenderness/mass/nodules Lungs: Decreased BS at bases. Clear anteriorly Heart: regular rate and rhythm, S1, S2 normal, no murmur, click, rub or gallop Extremities: trace BLLE edema  Lab Results: Results for orders placed during the hospital encounter of 03/02/12 (from the past 48 hour(s))  GLUCOSE, CAPILLARY     Status: Abnormal   Collection Time   03/05/12 11:33 AM      Component Value Range Comment   Glucose-Capillary 132 (*) 70 - 99 (mg/dL)    Comment 1 Notify RN     URINALYSIS, ROUTINE W REFLEX MICROSCOPIC     Status: Normal   Collection Time   03/05/12  3:10 PM      Component Value Range Comment   Color, Urine YELLOW  YELLOW     APPearance CLEAR  CLEAR     Specific Gravity, Urine 1.017  1.005 - 1.030     pH 5.5  5.0 - 8.0     Glucose, UA NEGATIVE  NEGATIVE (mg/dL)    Hgb urine dipstick NEGATIVE  NEGATIVE     Bilirubin Urine NEGATIVE  NEGATIVE     Ketones, ur NEGATIVE  NEGATIVE (mg/dL)    Protein, ur NEGATIVE  NEGATIVE (mg/dL)    Urobilinogen, UA 0.2  0.0 - 1.0 (mg/dL)    Nitrite NEGATIVE  NEGATIVE     Leukocytes, UA NEGATIVE  NEGATIVE  MICROSCOPIC NOT DONE ON URINES WITH NEGATIVE PROTEIN, BLOOD, LEUKOCYTES, NITRITE, OR GLUCOSE <1000 mg/dL.    GLUCOSE, CAPILLARY     Status: Abnormal   Collection Time   03/05/12  3:59 PM      Component Value Range Comment   Glucose-Capillary 115 (*) 70 - 99 (mg/dL)    Comment 1 Notify RN     GLUCOSE, CAPILLARY     Status: Abnormal   Collection Time   03/05/12  7:54 PM      Component Value Range Comment   Glucose-Capillary 114 (*) 70 - 99 (mg/dL)   GLUCOSE, CAPILLARY     Status: Abnormal   Collection Time   03/05/12 11:21 PM      Component Value Range Comment   Glucose-Capillary 103 (*) 70 - 99 (mg/dL)   COMPREHENSIVE METABOLIC PANEL     Status: Abnormal   Collection Time   03/06/12  4:24 AM      Component Value Range Comment   Sodium 132 (*) 135 - 145 (mEq/L)    Potassium 4.1  3.5 - 5.1 (mEq/L)    Chloride 90 (*) 96 - 112 (mEq/L) DELTA CHECK NOTED   CO2 39 (*) 19 - 32 (mEq/L)    Glucose, Bld 118 (*) 70 - 99 (mg/dL)    BUN 20  6 - 23 (mg/dL)    Creatinine, Ser 4.09 (*) 0.50 -  1.10 (mg/dL)    Calcium 8.7  8.4 - 10.5 (mg/dL)    Total Protein 6.1  6.0 - 8.3 (g/dL)    Albumin 2.9 (*) 3.5 - 5.2 (g/dL)    AST 36  0 - 37 (U/L)    ALT 17  0 - 35 (U/L)    Alkaline Phosphatase 78  39 - 117 (U/L)    Total Bilirubin 0.5  0.3 - 1.2 (mg/dL)    GFR calc non Af Amer >90  >90 (mL/min)    GFR calc Af Amer >90  >90 (mL/min)   GLUCOSE, CAPILLARY     Status: Abnormal   Collection Time   03/06/12  4:35 AM      Component Value Range Comment   Glucose-Capillary 109 (*) 70 - 99 (mg/dL)   GLUCOSE, CAPILLARY     Status: Abnormal   Collection Time   03/06/12  7:27 AM      Component Value Range Comment   Glucose-Capillary 109 (*) 70 - 99 (mg/dL)   GLUCOSE, CAPILLARY     Status: Abnormal   Collection Time   03/06/12 12:17 PM      Component Value Range Comment   Glucose-Capillary 120 (*) 70 - 99 (mg/dL)   GLUCOSE, CAPILLARY     Status: Abnormal   Collection Time   03/06/12  2:55 PM      Component Value Range Comment   Glucose-Capillary 121 (*) 70 - 99 (mg/dL)   GLUCOSE, CAPILLARY     Status: Abnormal    Collection Time   03/06/12  7:53 PM      Component Value Range Comment   Glucose-Capillary 130 (*) 70 - 99 (mg/dL)   GLUCOSE, CAPILLARY     Status: Abnormal   Collection Time   03/06/12 11:58 PM      Component Value Range Comment   Glucose-Capillary 107 (*) 70 - 99 (mg/dL)   GLUCOSE, CAPILLARY     Status: Abnormal   Collection Time   03/07/12  4:05 AM      Component Value Range Comment   Glucose-Capillary 110 (*) 70 - 99 (mg/dL)   BASIC METABOLIC PANEL     Status: Abnormal   Collection Time   03/07/12  4:06 AM      Component Value Range Comment   Sodium 134 (*) 135 - 145 (mEq/L)    Potassium 3.9  3.5 - 5.1 (mEq/L)    Chloride 89 (*) 96 - 112 (mEq/L)    CO2 40 (*) 19 - 32 (mEq/L)    Glucose, Bld 102 (*) 70 - 99 (mg/dL)    BUN 26 (*) 6 - 23 (mg/dL)    Creatinine, Ser 1.47  0.50 - 1.10 (mg/dL)    Calcium 8.6  8.4 - 10.5 (mg/dL)    GFR calc non Af Amer >90  >90 (mL/min)    GFR calc Af Amer >90  >90 (mL/min)   CBC     Status: Abnormal   Collection Time   03/07/12  4:06 AM      Component Value Range Comment   WBC 13.9 (*) 4.0 - 10.5 (K/uL)    RBC 2.98 (*) 3.87 - 5.11 (MIL/uL)    Hemoglobin 8.8 (*) 12.0 - 15.0 (g/dL)    HCT 82.9 (*) 56.2 - 46.0 (%)    MCV 93.0  78.0 - 100.0 (fL)    MCH 29.5  26.0 - 34.0 (pg)    MCHC 31.8  30.0 - 36.0 (g/dL)    RDW 14.6  11.5 - 15.5 (%)    Platelets 146 (*) 150 - 400 (K/uL)     Imaging: Imaging results have been reviewed  Assessment/Plan:   1. Principal Problem: 2.  *Exertional dyspnea 3. Active Problems: 4.  Anxiety disorder 5.  CAD, ? Stent at [facility] in 2001, cath 03/02/12 with 95% Lt. Main 6.  GERD (gastroesophageal reflux disease) 7.  NSTEMI, Troponin 0.83 on admission to [facility] 8.  COPD (chronic obstructive pulmonary disease) 9.  Smoker, 2ppd 10.  Obesity 11.  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   Time Spent Directly with Patient:  20 minutes  Length of Stay:  LOS: 5 days   POD #4. Slowly progressing. I/O  neg. Diuresing. VSS. Sats 97%. Short bursts of PAF. Exam benign. Labs OK. Plts increased. Can increase lopressor to q 8 hrs. Transfer to tele per TCTS.   Runell Gess 03/07/2012, 8:08 AM

## 2012-03-07 NOTE — Plan of Care (Signed)
Problem: Phase III Progression Outcomes Goal: Discharge plan remains appropriate-arrangements made Outcome: Completed/Met Date Met:  03/07/12 Pt states she will be staying at her sister's after discharge from hospital.

## 2012-03-08 ENCOUNTER — Encounter (HOSPITAL_COMMUNITY): Payer: Self-pay | Admitting: Cardiothoracic Surgery

## 2012-03-08 ENCOUNTER — Inpatient Hospital Stay (HOSPITAL_COMMUNITY): Payer: Medicaid Other

## 2012-03-08 LAB — GLUCOSE, CAPILLARY
Glucose-Capillary: 83 mg/dL (ref 70–99)
Glucose-Capillary: 90 mg/dL (ref 70–99)
Glucose-Capillary: 98 mg/dL (ref 70–99)
Glucose-Capillary: 92 mg/dL (ref 70–99)

## 2012-03-08 LAB — CBC
HCT: 26.9 % — ABNORMAL LOW (ref 36.0–46.0)
Hemoglobin: 8.5 g/dL — ABNORMAL LOW (ref 12.0–15.0)
MCH: 29.8 pg (ref 26.0–34.0)
MCHC: 31.6 g/dL (ref 30.0–36.0)
MCV: 94.4 fL (ref 78.0–100.0)
Platelets: 170 10*3/uL (ref 150–400)
RBC: 2.85 MIL/uL — ABNORMAL LOW (ref 3.87–5.11)
RDW: 14.4 % (ref 11.5–15.5)
WBC: 11 10*3/uL — ABNORMAL HIGH (ref 4.0–10.5)

## 2012-03-08 LAB — BASIC METABOLIC PANEL
BUN: 23 mg/dL (ref 6–23)
CO2: >45 mEq/L (ref 19–32)
Calcium: 8.5 mg/dL (ref 8.4–10.5)
Chloride: 84 mEq/L — ABNORMAL LOW (ref 96–112)
Creatinine, Ser: 0.56 mg/dL (ref 0.50–1.10)
GFR calc Af Amer: >90 mL/min (ref >90)
GFR calc non Af Amer: >90 mL/min (ref >90)
Glucose, Bld: 84 mg/dL (ref 70–99)
Potassium: 2.8 mEq/L — ABNORMAL LOW (ref 3.5–5.1)
Sodium: 135 mEq/L (ref 135–145)

## 2012-03-08 MED ORDER — FUROSEMIDE 10 MG/ML IJ SOLN
40.0000 mg | Freq: Once | INTRAMUSCULAR | Status: AC
  Administered 2012-03-08: 40 mg via INTRAVENOUS
  Filled 2012-03-08: qty 4

## 2012-03-08 MED ORDER — FUROSEMIDE 40 MG PO TABS
40.0000 mg | ORAL_TABLET | Freq: Two times a day (BID) | ORAL | Status: DC
  Administered 2012-03-08 – 2012-03-09 (×2): 40 mg via ORAL
  Filled 2012-03-08 (×4): qty 1

## 2012-03-08 MED ORDER — INSULIN ASPART 100 UNIT/ML ~~LOC~~ SOLN: 0.0000 [IU] | SUBCUTANEOUS | Status: DC

## 2012-03-08 MED ORDER — POLYSACCHARIDE IRON COMPLEX 150 MG PO CAPS
150.0000 mg | ORAL_CAPSULE | Freq: Every day | ORAL | Status: DC
  Administered 2012-03-08 – 2012-03-15 (×8): 150 mg via ORAL
  Filled 2012-03-08 (×8): qty 1

## 2012-03-08 MED ORDER — POTASSIUM CHLORIDE CRYS ER 20 MEQ PO TBCR
40.0000 meq | EXTENDED_RELEASE_TABLET | Freq: Two times a day (BID) | ORAL | Status: DC
  Administered 2012-03-08 – 2012-03-09 (×4): 40 meq via ORAL
  Filled 2012-03-08 (×6): qty 2

## 2012-03-08 MED ORDER — POTASSIUM CHLORIDE 10 MEQ/50ML IV SOLN
10.0000 meq | INTRAVENOUS | Status: AC | PRN
  Administered 2012-03-08 (×3): 10 meq via INTRAVENOUS

## 2012-03-08 MED ORDER — POTASSIUM CHLORIDE 10 MEQ/50ML IV SOLN
INTRAVENOUS | Status: AC
  Filled 2012-03-08: qty 150

## 2012-03-08 NOTE — Progress Notes (Signed)
[facility]  Subjective: Gets anxious.  Can't lay flat because she is scared she will choke.  Objective: Vital signs in last 24 hours: Temp:  [97.4 F (36.3 C)-98 F (36.7 C)] 98 F (36.7 C) (05/28 0756) Pulse Rate:  [63-90] 80  (05/28 1000) Resp:  [17-23] 20  (05/28 1000) BP: (71-114)/(37-69) 114/55 mmHg (05/28 1000) SpO2:  [93 %-100 %] 98 % (05/28 1000) Weight:  [88 kg (194 lb 0.1 oz)] 88 kg (194 lb 0.1 oz) (05/28 0600) Last BM Date: 03/07/12  Intake/Output from previous day: 05/27 0701 - 05/28 0700 In: 1755 [P.O.:1140; I.V.:203; IV Piggyback:412] Out: 3575 [Urine:3575] Intake/Output this shift: Total I/O In: 300 [IV Piggyback:300] Out: 1100 [Urine:1100]  Medications Current Facility-Administered Medications  Medication Dose Route Frequency Provider Last Rate Last Dose  . 0.45 % sodium chloride infusion   Intravenous Continuous Ardelle Balls, PA 20 mL/hr at 03/07/12 1745    . 0.9 %  sodium chloride infusion  250 mL Intravenous Continuous Ardelle Balls, PA      . acetaminophen (TYLENOL) tablet 1,000 mg  1,000 mg Oral Q6H Kerin Perna, MD   1,000 mg at 03/08/12 0601   Or  . acetaminophen (TYLENOL) solution 975 mg  975 mg Per Tube Q6H Kerin Perna, MD   975 mg at 03/04/12 0558  . ALPRAZolam Prudy Feeler) tablet 1.5 mg  1.5 mg Oral BID Kerin Perna, MD   1.5 mg at 03/08/12 0947  . amiodarone (NEXTERONE PREMIX) 360 mg/200 mL dextrose 360 MG/200ML IV infusion           . amiodarone (PACERONE) tablet 400 mg  400 mg Oral Q12H Kerin Perna, MD   400 mg at 03/08/12 0947  . aspirin EC tablet 325 mg  325 mg Oral Daily Ardelle Balls, [place]   325 mg at 03/08/12 4098   Or  . aspirin chewable tablet 324 mg  324 mg Per Tube Daily Donielle Margaretann Loveless, PA      . bisacodyl (DULCOLAX) EC tablet 10 mg  10 mg Oral Daily Ardelle Balls, PA   10 mg at 03/06/12 1191   Or  . bisacodyl (DULCOLAX) suppository 10 mg  10 mg Rectal Daily  Ardelle Balls, PA      . budesonide (PULMICORT) nebulizer solution 0.5 mg  0.5 mg Nebulization BID Kerin Perna, MD   0.5 mg at 03/08/12 0717  . dextromethorphan-guaiFENesin (MUCINEX DM) 30-600 MG per 12 hr tablet 1 tablet  1 tablet Oral BID Kerin Perna, MD   1 tablet at 03/08/12 [phone]  . docusate sodium (COLACE) capsule 200 mg  200 mg Oral Daily Ardelle Balls, PA   200 mg at 03/06/12 0943  . furosemide (LASIX) injection 40 mg  40 mg Intravenous Once Wilmon Pali, PA   40 mg at 03/08/12 0902  . furosemide (LASIX) tablet 40 mg  40 mg Oral BID Wilmon Pali, PA      . insulin aspart (novoLOG) injection 0-24 Units  0-24 Units Subcutaneous Q4H Kerin Perna, MD   2 Units at 03/07/12 1200  . insulin glargine (LANTUS) injection 14 Units  14 Units Subcutaneous BH-q7a Kerin Perna, MD   14 Units at 03/08/12 [phone]  . iron polysaccharides (NIFEREX) capsule 150 mg  150 mg Oral Daily Wilmon Pali, PA   150 mg at 03/08/12 0948  . levalbuterol (XOPENEX) nebulizer solution 0.63 mg  0.63 mg Nebulization  Q6H PRN Abelino Derrick, PA   0.63 mg at 03/07/12 0430  . levalbuterol (XOPENEX) nebulizer solution 0.63 mg  0.63 mg Nebulization TID BM Kerin Perna, MD   0.63 mg at 03/08/12 0717  . metoCLOPramide (REGLAN) injection 10 mg  10 mg Intravenous Q6H Kerin Perna, MD   10 mg at 03/07/12 1747  . metoprolol (LOPRESSOR) injection 2.5-5 mg  2.5-5 mg Intravenous Q2H PRN Ardelle Balls, PA   5 mg at 03/07/12 0900  . morphine 2 MG/ML injection 2-5 mg  2-5 mg Intravenous Q1H PRN Ardelle Balls, PA   2 mg at 03/04/12 0904  . moxifloxacin (AVELOX) tablet 400 mg  400 mg Oral q1800 Kerin Perna, MD   400 mg at 03/07/12 1747  . nicotine (NICODERM CQ - dosed in mg/24 hours) patch 21 mg  21 mg Transdermal Daily Rometta Emery, MD   21 mg at 03/08/12 0948  . ondansetron (ZOFRAN) injection 4 mg  4 mg Intravenous Q6H PRN Ardelle Balls, PA   4 mg at 03/06/12 1733  . oxyCODONE (Oxy  IR/ROXICODONE) immediate release tablet 5-10 mg  5-10 mg Oral Q3H PRN Ardelle Balls, PA   5 mg at 03/05/12 0029  . pantoprazole (PROTONIX) EC tablet 40 mg  40 mg Oral Q1200 Ardelle Balls, PA   40 mg at 03/07/12 1145  . potassium chloride 10 mEq in 50 mL *CENTRAL LINE* IVPB  10 mEq Intravenous Q1H PRN Kerin Perna, MD   10 mEq at 03/08/12 0902  . potassium chloride 10 MEQ/50ML IVPB           . potassium chloride SA (K-DUR,KLOR-CON) CR tablet 40 mEq  40 mEq Oral BID Wilmon Pali, PA   40 mEq at 03/08/12 0947  . simvastatin (ZOCOR) tablet 20 mg  20 mg Oral q1800 Kerin Perna, MD   20 mg at 03/07/12 1747  . sodium chloride 0.9 % injection 10-40 mL  10-40 mL Intracatheter Q12H Kerin Perna, MD   20 mL at 03/07/12 2200  . sodium chloride 0.9 % injection 10-40 mL  10-40 mL Intracatheter PRN Kerin Perna, MD      . sodium chloride 0.9 % injection 3 mL  3 mL Intravenous Q12H Ardelle Balls, PA   3 mL at 03/07/12 1000  . sodium chloride 0.9 % injection 3 mL  3 mL Intravenous PRN Ardelle Balls, PA      . traMADol Janean Sark) tablet 50 mg  50 mg Oral Q6H PRN Kerin Perna, MD   50 mg at 03/08/12 0842  . vancomycin (VANCOCIN) IVPB 1000 mg/200 mL premix  1,000 mg Intravenous Q12H Kerin Perna, MD   1,000 mg at 03/08/12 0947  . DISCONTD: amiodarone (PACERONE) tablet 200 mg  200 mg Oral Daily Kerin Perna, MD      . DISCONTD: furosemide (LASIX) injection 40 mg  40 mg Intravenous BID Kerin Perna, MD   40 mg at 03/07/12 1747  . DISCONTD: metoprolol tartrate (LOPRESSOR) 25 mg/10 mL oral suspension 12.5 mg  12.5 mg Per Tube BID Kerin Perna, MD      . DISCONTD: metoprolol tartrate (LOPRESSOR) tablet 12.5 mg  12.5 mg Oral BID Kerin Perna, MD   12.5 mg at 03/07/12 2200    PE: General appearance: alert, cooperative, no distress and Sitting comfortably in a wheel chair waiting to go to ultrasound. Lungs: Decreased BS bilaterally. Heart: regular  rate and rhythm,  S1, S2 normal, no murmur, click, rub or gallop Extremities: No LEE Pulses: 1+ right radial.  left radial not palpable.  Lab Results:   Basename 03/08/12 0400 03/07/12 0406  WBC 11.0* 13.9*  HGB 8.5* 8.8*  HCT 26.9* 27.7*  PLT 170 146*   BMET  Basename 03/08/12 0400 03/07/12 0406 03/06/12 0424  NA 135 134* 132*  K 2.8* 3.9 4.1  CL 84* 89* 90*  CO2 >45* 40* 39*  GLUCOSE 84 102* 118*  BUN 23 26* 20  CREATININE 0.56 0.53 0.48*  CALCIUM 8.5 8.6 8.7   CHEST - 2 VIEW  Comparison: Chest x-ray 03/07/2012.  Findings: There is a right upper extremity PICC with tip  terminating in the superior aspect of the right atrium. The patient  is status post median sternotomyfor CABG. There continues to be  opacity at the base of the left hemithorax with associated blunting  of the left costophrenic sulcus. Right lung appears clear. Small  right-sided pleural effusion is unchanged. Pulmonary vasculature  is within normal limits. Heart size is borderline enlarged.  Mediastinal contours are unremarkable.  IMPRESSION:  1. New right upper extremity PICC with tip terminating in the  right atrium.  2. Small right and moderate left-sided pleural effusions with  atelectasis and/or consolidation in the left lower lobe. Overall,  findings are similar to prior study.  Assessment/Plan    Principal Problem:  *Exertional dyspnea Active Problems:  Anxiety disorder  CAD, ? Stent at [facility] in 2001, cath 03/02/12 with 95% Lt. 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  Diuresing well.  Net fluids - .  Thoracentesis today.  K+ repleted.   Hgb stable at 8.5.  BP 100/48 - 114/55.  ASA/ amiodarone/lasix/statin/    LOS: 6 days     HAGER, BRYAN 03/08/2012 11:29 AM  ATTENDING ATTESTATION:  I have seen and examined the patient along with Wilburt Finlay, PA.  I have reviewed the chart, notes and new data.  I agree with Bryan's note.   Brief Description: 51 y/o obese woman with COPD - admitted for Unstable Angina/NSTEMI- LM Disease --> s/p CABG.  Has been having breathing difficulties & is now s/p thoracentesis earlier today.  Transferred to telemetry.  Has had intermittent short burst of Afib - relatively well controlled on PO Amiodarone.  Continuing to diurese on PO Lasix. - monitor K & Mag level  No room as of yet to add BB & ACE-I with borderline BPs.    On statin for HLD.  On Abx for sternal wound drainage.  Inhalers for COPD.  On Iron supplementation for post-op Anemia.  PLAN:  Continued diuresis & supportive care per CT Surgery.    On amiodarone for Afib  As / If BP will tolerate, would consider low dose ACE-I, but no BP room for now.  Will follow along.  She will need OP Cardiologist established for D/C - I think she is from [place] area (covered usually by [facility]).  Marykay Lex, M.D., M.S. [facility] [address] [place], [place]  [address]  [phone]  03/08/2012 3:03 PM

## 2012-03-08 NOTE — Progress Notes (Signed)
CRITICAL VALUE ALERT  Critical value received:  CO2 >45   Date of notification:  03/08/2012  Time of notification:  0740am  Critical value read back:yes  Nurse who received alert:  L Cledith Kamiya RN  MD notified (1st page):  Donata Clay, will see on AM rounds   Patient alert and oriented, RN at bedside for shift assessment and working with patient on IS and Flutter valve.  Patient without complaints at this time. Will continue to monitor and follow up. L Amandalynn Pitz RN

## 2012-03-08 NOTE — Progress Notes (Signed)
[address]            [place] [address]          [phone]     5 Days Post-Op Procedure(s) (LRB): CORONARY ARTERY BYPASS GRAFTING (CABG) (N/A)  Subjective: OOB in chair.  Still with cough, mostly nonproductive.  Feels better overall.  Walked in halls yesterday.  Objective: Vital signs in last 24 hours: Patient Vitals for the past 24 hrs:  BP Temp Temp src Pulse Resp SpO2 Weight  03/08/12 0718 - - - - - 100 % -  03/08/12 0700 101/45 mmHg - - 73  21  96 % -  03/08/12 0600 109/54 mmHg - - 81  17  94 % 194 lb 0.1 oz (88 kg)  03/08/12 0500 111/51 mmHg - - 71  19  98 % -  03/08/12 0400 102/47 mmHg - - 72  20  98 % -  03/08/12 0300 97/52 mmHg - - 69  18  99 % -  03/08/12 0200 94/45 mmHg - - - 18  95 % -  03/08/12 0100 96/51 mmHg - - 73  20  99 % -  03/08/12 0000 85/40 mmHg 98 F (36.7 C) Axillary 68  20  99 % -  03/07/12 2300 71/56 mmHg - - 83  19  93 % -  03/07/12 2200 96/48 mmHg - - 90  19  97 % -  03/07/12 2100 110/47 mmHg - - 81  22  95 % -  03/07/12 2000 108/53 mmHg - - 81  23  95 % -  03/07/12 1954 - 97.4 F (36.3 C) Oral - - - -  03/07/12 1925 - - - - - 94 % -  03/07/12 1900 110/52 mmHg - - 82  20  93 % -  03/07/12 1800 96/47 mmHg - - 72  19  99 % -  03/07/12 1700 104/42 mmHg - - 82  21  93 % -  03/07/12 1600 97/48 mmHg - - 74  20  96 % -  03/07/12 1525 - 98 F (36.7 C) Oral - - - -  03/07/12 1500 103/49 mmHg - - 74  20  98 % -  03/07/12 1428 - - - - - 100 % -  03/07/12 1400 86/69 mmHg - - 74  21  96 % -  03/07/12 1315 85/41 mmHg - - 69  20  96 % -  03/07/12 1300 87/42 mmHg - - 65  19  97 % -  03/07/12 1245 92/38 mmHg - - 72  20  97 % -  03/07/12 1230 104/37 mmHg - - 63  18  98 % -  03/07/12 1215 88/42 mmHg - - 75  20  97 % -  03/07/12 1214 - 97.9 F (36.6 C) Oral - - - -  03/07/12 1200 92/45 mmHg - - 75  20  95 % -  03/07/12 1145 96/42 mmHg - - 78  18  97 % -  03/07/12 1130 97/48 mmHg - - 74  20  97 % -  03/07/12 1115  95/55 mmHg - - 75  20  97 % -  03/07/12 1100 88/53 mmHg - - 74  21  98 % -  03/07/12 1045 76/43 mmHg - - 81  21  95 % -  03/07/12 1030 110/56 mmHg - - 82  20  97 % -  03/07/12 1015 100/56 mmHg - - 88  21  94 % -  03/07/12 1000 85/72 mmHg - - 89  20  94 % -  03/07/12 0945 100/60 mmHg - - 86  20  93 % -  03/07/12 0930 100/85 mmHg - - 90  24  56 % -  03/07/12 0915 90/51 mmHg - - 76  19  100 % -  03/07/12 0900 - - - 48  18  100 % -  03/07/12 0845 - - - 88  18  97 % -  03/07/12 0830 - - - 88  20  97 % -  03/07/12 0815 - - - 89  18  96 % -  03/07/12 0800 98/60 mmHg - - 87  19  99 % -  03/07/12 0759 - 97.6 F (36.4 C) Oral - - - -   Current Weight  03/08/12 194 lb 0.1 oz (88 kg)  PRE OP WEIGHT: 88 kg    Intake/Output from previous day: 05/27 0701 - 05/28 0700 In: 1755 [P.O.:1140; I.V.:203; IV Piggyback:412] Out: 3575 [Urine:3575]  CBGs 107-107-84-84   PHYSICAL EXAM:  Heart: RRR Lungs: crackles in bases bilaterally Wound: small amount of serous drainage from lower sternum, no erythema or purulence Extremities: mild LE edema  Lab Results: CBC: Basename 03/08/12 0400 03/07/12 0406  WBC 11.0* 13.9*  HGB 8.5* 8.8*  HCT 26.9* 27.7*  PLT 170 146*   BMET:  Basename 03/08/12 0400 03/07/12 0406  NA 135 134*  K 2.8* 3.9  CL 84* 89*  CO2 >45* 40*  GLUCOSE 84 102*  BUN 23 26*  CREATININE 0.56 0.53  CALCIUM 8.5 8.6    CXR: IMPRESSION:  1. New right upper extremity PICC with tip terminating in the  right atrium.  2. Small right and moderate left-sided pleural effusions with  atelectasis and/or consolidation in the left lower lobe. Overall,  findings are similar to prior study.   Assessment/Plan: S/P Procedure(s) (LRB): CORONARY ARTERY BYPASS GRAFTING (CABG) (N/A) CV- AF, now SR.  Continue po Amiodarone. BPs low (70-100 systolic).  Will hold beta blocker for now.. Vol overload- Continue diuresis. UOP excellent. Hypokalemia- receiving runs of K+.  Will add a po  dose. CBGs stable, no h/o DM.  A1C=5.9. On low dose Lantus. Pulm- Continue Avelox, pulm toilet/nebs. May need thoracentesis on left. Sternal drainage- stable, serous. Continue local wound care and monitor. Expected post-op blood loss anemia- add Fe.  LOS: 6 days    Yassir Enis H 03/08/2012

## 2012-03-08 NOTE — Progress Notes (Signed)
patient examined and medical record reviewed,agree with above note.  L thoracentesis Cont IV vanco for sternal drainage DM well controllesd Maintaining NSR on po amio [name],Rowen Hur 03/08/2012

## 2012-03-08 NOTE — Progress Notes (Signed)
Patient transported to 2000, room 2017.  Report called and Shanda Bumps, RN met [place] in room upon arrival.  Patient hooked to their monitor with no complications noted.

## 2012-03-08 NOTE — Procedures (Signed)
Procedure : left thoracentesis Specimen : 500 ml blood tinged serous fluid Complications : none immediate  Post CXR pending.  No labs ordered on fluid

## 2012-03-09 ENCOUNTER — Inpatient Hospital Stay (HOSPITAL_COMMUNITY): Payer: Medicaid Other

## 2012-03-09 DIAGNOSIS — J90 Pleural effusion, not elsewhere classified: Secondary | ICD-10-CM | POA: Diagnosis not present

## 2012-03-09 DIAGNOSIS — I480 Paroxysmal atrial fibrillation: Secondary | ICD-10-CM | POA: Diagnosis not present

## 2012-03-09 DIAGNOSIS — E876 Hypokalemia: Secondary | ICD-10-CM | POA: Diagnosis not present

## 2012-03-09 LAB — BASIC METABOLIC PANEL
BUN: 19 mg/dL (ref 6–23)
CO2: >45 mEq/L (ref 19–32)
Calcium: 8.5 mg/dL (ref 8.4–10.5)
GFR calc non Af Amer: >90 mL/min (ref >90)
Glucose, Bld: 100 mg/dL — ABNORMAL HIGH (ref 70–99)
Potassium: 2.9 mEq/L — ABNORMAL LOW (ref 3.5–5.1)
Sodium: 132 mEq/L — ABNORMAL LOW (ref 135–145)

## 2012-03-09 LAB — CBC
Hemoglobin: 8.8 g/dL — ABNORMAL LOW (ref 12.0–15.0)
MCH: 30.1 pg (ref 26.0–34.0)
MCHC: 32.2 g/dL (ref 30.0–36.0)
MCV: 93.5 fL (ref 78.0–100.0)
RBC: 2.92 MIL/uL — ABNORMAL LOW (ref 3.87–5.11)

## 2012-03-09 LAB — GLUCOSE, CAPILLARY
Glucose-Capillary: 99 mg/dL (ref 70–99)
Glucose-Capillary: 82 mg/dL (ref 70–99)
Glucose-Capillary: 80 mg/dL (ref 70–99)

## 2012-03-09 MED ORDER — INSULIN ASPART 100 UNIT/ML ~~LOC~~ SOLN: 0.0000 [IU] | Freq: Three times a day (TID) | SUBCUTANEOUS | Status: DC

## 2012-03-09 MED ORDER — FUROSEMIDE 40 MG PO TABS
40.0000 mg | ORAL_TABLET | Freq: Every day | ORAL | Status: DC
  Filled 2012-03-09: qty 1

## 2012-03-09 MED ORDER — POTASSIUM CHLORIDE CRYS ER 20 MEQ PO TBCR
40.0000 meq | EXTENDED_RELEASE_TABLET | Freq: Once | ORAL | Status: AC
  Administered 2012-03-09: 40 meq via ORAL
  Filled 2012-03-09: qty 2

## 2012-03-09 NOTE — Progress Notes (Signed)
Pt ambulated 350 ft with RW on 2L O2 with minimal assist.  Gait slow and steady, no rests.   No complaints of pain or SOB.  Returned to bed with call bell in reach and family at bedside.  Will con't to monitor.

## 2012-03-09 NOTE — Progress Notes (Signed)
Pt with intermittent unsustained afib RVR throughout shift,  HR 120-160, then return to NSR 70-80's.  MD aware, orders for supplemental potassium received and carried out.  Asymptomatic other than "heart racing."   Will con't to monitor closely.

## 2012-03-09 NOTE — Care Management Note (Addendum)
    Page 1 of 2   03/14/2012     1:46:34 PM   CARE MANAGEMENT NOTE 03/14/2012  Patient:  Victoria Bell, Victoria Bell   Account Number:  [id number]  Date Initiated:  03/04/2012  Documentation initiated by:  Avie Arenas  Subjective/Objective Assessment:   Post op CABG x2  Lives alone in [place].     Action/Plan:   MET WITH PT AND FAMILY TO DISCUSS DC PLANS.  PT PLANS TO DC HOME WITH HER SISTER.  SHE IS UNINSURED.   Anticipated DC Date:  03/15/2012   Anticipated DC Plan:  HOME W HOME HEALTH SERVICES      DC Planning Services  CM consult  Medication Assistance      [facility] Choice  HOME HEALTH   Choice offered to / List presented to:  C-1 Patient   DME arranged  [facility]  3-N-1      DME agency  [facility]     [facility] arranged  HH-1 RN      [facility] agency  [facility]   Status of service:  Completed, signed off Medicare Important Message given?   (If response is "NO", the following Medicare IM given date fields will be blank) Date Medicare IM given:   Date Additional Medicare IM given:    Discharge Disposition:  HOME W HOME HEALTH SERVICES  Per UR Regulation:  Reviewed for med. necessity/level of care/duration of stay  If discussed at Long Length of Stay Meetings, dates discussed:    Comments:  03/14/12  1341  Poet Hineman SIMMONS RN, BSN [phone] RECEIVED REFERRAL FOR 3 N 1 FOR HOME; REFERRAL PLACED TO [facility] WITH [facility] FOR DME TO BE DELIVERED TO ROOM.   03/13/2012 1400 [facility] delivered RW to pt's room.  Faxed HH RN orders to [place] with instruction for suture removal in on week. Isidoro Donning RN CCM Case Mgmt phone [phone]   03/11/12 JULIE AMERSON,RN,BSN 1600 FINANCIAL COUNSELOR HAS ASSISTED PT WITH MEDICAID APPLICATION WHILE IN HOSPITAL.  PT WILL NEED HOME O2 AT DISCHARGE--WILL REFER TO [facility] FOR DME NEEDS. HHRN FOR RESTORATIVE CARE ORDERED:  [place] TO PICK UP CASE, PER ANGELA EGGARD WITH HH AGENCY. CENTRAL INTAKE #  [phone] FAX # [phone] HH AGENCY STATES THEY CAN SEE PT ON TUESDAY 6/4 FOR FIRST VISIT.  PT AGREEABLE TO HH AND OXYGEN SET UP, YET BECAME TEARY, AS SHE IS WORRIED ABOUT MEDICAL BILLS.  COMFORTED AND REASSURED PT--HOPEFUL FOR MEDICAID TO COME THROUGH SOON.  03/09/12 JULIE AMERSON,RN,BSN 1115 PT STATES SHE IS CONCERNED ABOUT PAYING FOR DC MEDS, AS SHE HAS NO PAYOR SOURCE.  SHE WAS ONLY TAKING XANAX PRIOR TO ADMISSION.  PT/SISTER STATE THAT IF MEDS ARE MORE THAN 5-10 DOLLARS, THEY WILL NOT BE ABLE TO GET THEM FILLED.  WOULD ENCOURAGE MD TO USE GENERIC MEDS FROM $4 [facility] LIST IF AT ALL POSSIBLE.  SPOKE WITH MATTIE IN FINANCIAL COUNSELING, SHE STATES SHE WILL SPEAK WITH PT AND FAMILY REGARDING POSSIBLE DISABILITY BENEFITS.  PT STATES SHE APPLIED FOR MEDICAID 3 MONTHS AGO, BUT WAS TURNED DOWN.  PT STATES SHE DOES HAVE PCP IN [place]  CHECKED [place] [facility] [place] WEB SITE FOR RX ASSISTANCE, AND THEY DO NOT HAVE ASSISTANCE AVAILABLE.  WILL FOLLOW UP WITH DC MEDS.  PT IS ELIGIBLE FOR 3 DAYS WORTH OF ALL DC MEDS AT DC THROUGH PHARMACY [facility]; WILL NEED DUPLICATE RX AT DC.

## 2012-03-09 NOTE — Progress Notes (Addendum)
[address]            [place] [address]          [phone]     6 Days Post-Op  Procedure(s) (LRB): CORONARY ARTERY BYPASS GRAFTING (CABG) (N/A) Subjective: C/O some nausea   Objective  Telemetry SR, PAC's , afib  Temp:  [97.7 F (36.5 C)-98.5 F (36.9 C)] 98.5 F (36.9 C) (05/29 0425) Pulse Rate:  [71-86] 86  (05/29 0425) Resp:  [16-22] 19  (05/29 0425) BP: (94-128)/(47-80) 128/80 mmHg (05/29 0425) SpO2:  [92 %-98 %] 94 % (05/29 0820) Weight:  [186 lb 4.6 oz (84.5 kg)] 186 lb 4.6 oz (84.5 kg) (05/29 0425)   Intake/Output Summary (Last 24 hours) at 03/09/12 0827 Last data filed at 03/08/12 2250  Gross per 24 hour  Intake   1020 ml  Output   1875 ml  Net   -855 ml       General appearance: alert, cooperative and no distress Heart: regular rate and rhythm and S1, S2 normal Lungs: diminished in bases Abdomen: soft, nontender, + BS Extremities: + LE edema Wound: sero sang drainage lower pole of sternal incision  Lab Results:  Basename 03/09/12 0215 03/08/12 0400  NA 132* 135  K 2.9* 2.8*  CL 81* 84*  CO2 >45* >45*  GLUCOSE 100* 84  BUN 19 23  CREATININE 0.58 0.56  CALCIUM 8.5 8.5  MG -- --  PHOS -- --   No results found for this basename: AST:2,ALT:2,ALKPHOS:2,BILITOT:2,PROT:2,ALBUMIN:2 in the last 72 hours No results found for this basename: LIPASE:2,AMYLASE:2 in the last 72 hours  Basename 03/09/12 0215 03/08/12 0400  WBC 13.4* 11.0*  NEUTROABS -- --  HGB 8.8* 8.5*  HCT 27.3* 26.9*  MCV 93.5 94.4  PLT 195 170   No results found for this basename: CKTOTAL:4,CKMB:4,TROPONINI:4 in the last 72 hours No components found with this basename: POCBNP:3 No results found for this basename: DDIMER in the last 72 hours No results found for this basename: HGBA1C in the last 72 hours No results found for this basename: CHOL,HDL,LDLCALC,TRIG,CHOLHDL in the last 72 hours No results found for this basename:  TSH,T4TOTAL,FREET3,T3FREE,THYROIDAB in the last 72 hours No results found for this basename: VITAMINB12,FOLATE,FERRITIN,TIBC,IRON,RETICCTPCT in the last 72 hours  Medications: Scheduled    . ALPRAZolam  1.5 mg Oral BID  . amiodarone  400 mg Oral Q12H  . aspirin EC  325 mg Oral Daily   Or  . aspirin  324 mg Per Tube Daily  . bisacodyl  10 mg Oral Daily   Or  . bisacodyl  10 mg Rectal Daily  . budesonide  0.5 mg Nebulization BID  . dextromethorphan-guaiFENesin  1 tablet Oral BID  . docusate sodium  200 mg Oral Daily  . furosemide  40 mg Intravenous Once  . furosemide  40 mg Oral BID  . insulin aspart  0-24 Units Subcutaneous Q4H  . iron polysaccharides  150 mg Oral Daily  . levalbuterol  0.63 mg Nebulization TID BM  . moxifloxacin  400 mg Oral q1800  . nicotine  21 mg Transdermal Daily  . pantoprazole  40 mg Oral Q1200  . potassium chloride  40 mEq Oral BID  . simvastatin  20 mg Oral q1800  . sodium chloride  10-40 mL Intracatheter Q12H  . vancomycin  1,000 mg Intravenous Q12H  . DISCONTD: acetaminophen (TYLENOL) oral liquid  160 mg/5 mL  975 mg Per Tube Q6H  . DISCONTD: acetaminophen  1,000 mg Oral Q6H  . DISCONTD: insulin aspart  0-24 Units Subcutaneous Q4H  . DISCONTD: insulin glargine  14 Units Subcutaneous BH-q7a  . DISCONTD: sodium chloride  3 mL Intravenous Q12H     Radiology/Studies:  Dg Chest 2 View  03/09/2012  *RADIOLOGY REPORT*  Clinical Data: Follow up bilateral pleural effusion  CHEST - 2 VIEW  Comparison: 03/08/2012  Findings: There is a right arm PICC line with tip in the SVC.  The heart size appears normal.  There has been interval improvement in right pleural effusion. Persistent small to moderate left pleural effusion.  Hazy opacity is identified in the projection of the left upper lobe.  IMPRESSION:  1.  Persistent left pleural effusion. 2.  Interval improvement in the right effusion.  Original Report Authenticated By: Rosealee Albee, M.D.   Dg Chest 2  View  03/08/2012  *RADIOLOGY REPORT*  Clinical Data: Left-sided pleural effusion.  CHEST - 2 VIEW  Comparison: Chest x-ray 03/07/2012.  Findings: There is a right upper extremity PICC with tip terminating in the superior aspect of the right atrium. The patient is status post median sternotomyfor CABG. There continues to be opacity at the base of the left hemithorax with associated blunting of the left costophrenic sulcus.  Right lung appears clear. Small right-sided pleural effusion is unchanged.  Pulmonary vasculature is within normal limits.  Heart size is borderline enlarged. Mediastinal contours are unremarkable.  IMPRESSION:  1. New right upper extremity PICC with tip terminating in the right atrium. 2.  Small right and moderate left-sided pleural effusions with atelectasis and/or consolidation in the left lower lobe.  Overall, findings are similar to prior study.  Original Report Authenticated By: Florencia Reasons, M.D.   Dg Chest Port 1 View  03/08/2012  *RADIOLOGY REPORT*  Clinical Data: Left thoracentesis  PORTABLE CHEST - 1 VIEW  Comparison: 03/08/2012  Findings: No pneumothorax following left thoracentesis.  Small pleural effusions remain bilaterally with decrease in the left effusion following thoracentesis.  Basilar compressive atelectasis evident.  Right PICC line tip SVC.  Previous coronary bypass changes noted.  Heart is enlarged without CHF or definite edema.  IMPRESSION: Improving left effusion following thoracentesis.  No pneumothorax.  Original Report Authenticated By: Judie Petit. Ruel Favors, M.D.   US Thoracentesis Asp Pleural Space W/img Guide  03/08/2012  *RADIOLOGY REPORT*  Clinical Data:  Postoperative effusion.  Request has been made for therapeutic thoracentesis.  ULTRASOUND GUIDED left THORACENTESIS  Comparison:  Prior chest x-ray revealing left greater than right pleural effusion.  An ultrasound guided thoracentesis was thoroughly discussed with the patient and questions answered.  The  benefits, risks, alternatives and complications were also discussed.  The patient understands and wishes to proceed with the procedure.  Written consent was obtained.  Ultrasound was performed to localize and mark an adequate pocket of fluid in the left chest.  The area was then prepped and draped in the normal sterile fashion.  1% Lidocaine was used for local anesthesia.  Under ultrasound guidance a 19 gauge Yueh catheter was introduced.  Thoracentesis was performed.  The catheter was removed and a dressing applied.  Complications:  None immediate  Findings: A total of approximately left of 500 ml fluid was removed. A fluid sample was not sent for laboratory analysis.  IMPRESSION: Successful ultrasound guided left thoracentesis yielding 500 ml of pleural fluid.  Read by: Anselm Pancoast, P.A.-C  Original  Report Authenticated By: Osa Craver, M.D.    INR: Will add last result for INR, ABG once components are confirmed Will add last 4 CBG results once components are confirmed  Assessment/Plan: S/P Procedure(s) (LRB): CORONARY ARTERY BYPASS GRAFTING (CABG) (N/A)  1. Some afib last night, cont current amio dosing 2 cont resp rx 3 diuresis- cont. Decrease as has developed contraction alkalosis, check bnp, replace K+, repeat BMET in am 4 cbg's well controlled  5 cont current abx- sternal drainage 6 H/H ok  LOS: 7 days    GOLD,WAYNE E 5/29/20138:27 AM    CXR improved post thoracentesis Cont aggressive diuresis

## 2012-03-09 NOTE — Progress Notes (Signed)
CRITICAL VALUE ALERT  Critical value received:  CO2 >45  Date of notification:  03/09/2012   Time of notification:  02:15 am  Critical value read back:yes  Nurse who received alert:  Binnie Kand   MD notified (1st page):  MD already aware of critical CO2 levels

## 2012-03-09 NOTE — Progress Notes (Signed)
CARDIAC REHAB PHASE I   PRE:  Rate/Rhythm: 81 SR    BP: sitting 99/52    SaO2: 96 2L  MODE:  Ambulation: 350 ft   POST:  Rate/Rhythm: 96 SR    BP: sitting 98/49     SaO2: 96 2L, 70 RA after BR trip  Pt groggy but able to stay awake and walked fairly steady with RW and 2L. Sts she just feels bad. Tolerated fine on 2L but D/C'd O2 to go to BR after walk and desat to 70 RA. Quickly up to 91 on 2L with rest in recliner. Poor appetite. Will f/u.  [id number]  Harriet Masson CES, ACSM

## 2012-03-09 NOTE — Progress Notes (Signed)
Subjective:  Up in chair, looks a little stronger today.  Objective:  Vital Signs in the last 24 hours: Temp:  [97.7 F (36.5 C)-98.5 F (36.9 C)] 98.5 F (36.9 C) (05/29 0425) Pulse Rate:  [71-86] 86  (05/29 0425) Resp:  [16-22] 19  (05/29 0425) BP: (94-128)/(47-80) 128/80 mmHg (05/29 0425) SpO2:  [92 %-98 %] 94 % (05/29 0820) Weight:  [84.5 kg (186 lb 4.6 oz)] 84.5 kg (186 lb 4.6 oz) (05/29 0425)  Intake/Output from previous day:  Intake/Output Summary (Last 24 hours) at 03/09/12 0833 Last data filed at 03/08/12 2250  Gross per 24 hour  Intake   1020 ml  Output   1875 ml  Net   -855 ml    Physical Exam: General appearance: alert, cooperative, no distress and morbidly obese Lungs: decrease breath sounds on Lt base Heart: regular rate and rhythm   Rate: 84  Rhythm: normal sinus rhythm and short runs of PAF  Lab Results:  Basename 03/09/12 0215 03/08/12 0400  WBC 13.4* 11.0*  HGB 8.8* 8.5*  PLT 195 170    Basename 03/09/12 0215 03/08/12 0400  NA 132* 135  K 2.9* 2.8*  CL 81* 84*  CO2 >45* >45*  GLUCOSE 100* 84  BUN 19 23  CREATININE 0.58 0.56   Imaging: Dg Chest 2 View  03/09/2012  *RADIOLOGY REPORT*  Clinical Data: Follow up bilateral pleural effusion  CHEST - 2 VIEW  Comparison: 03/08/2012  Findings: There is a right arm PICC line with tip in the SVC.  The heart size appears normal.  There has been interval improvement in right pleural effusion. Persistent small to moderate left pleural effusion.  Hazy opacity is identified in the projection of the left upper lobe.  IMPRESSION:  1.  Persistent left pleural effusion. 2.  Interval improvement in the right effusion.  Original Report Authenticated By: Rosealee Albee, M.D.   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 Stent at [facility] in 2001, cath  03/02/12 with 95% Lt. Main  COPD (chronic obstructive pulmonary disease)  PAF post op- Amiodarone  Pleural effusion, s/p thoracentesis 5/28  Hypokalemia, K= 2.9 03/09/12  Anxiety disorder  GERD (gastroesophageal reflux disease)  Smoker, 2ppd  Obesity   Plan- K+ ordered. She is on Avelox. She is on Lasix and diuresisng (-1100 last 24hrs). She was transferred from [facility] here because [facility] were full.   Corine Shelter PA-C 03/09/2012, 8:33 AM  I have seen and examined the patient, & reviewed the chart.  I agree with the findings, exam and impression as noted above in Mr. Wynelle Link note.  She is steadily progressing POD #6 s/p 2V CABG.   POD#1 s/p R thoracentesis.  Intermittent bouts of Afib noted on telemetery (rates as high as 140s) with some aberrantly conducted beats -- is on PO Amiodarone; not on BB due to COPD & borderline hypotension.  BP still not sufficient to initiate BB or ACE-I dose. Continues to diurese well on PO Lasix (autodiuresis) - but K is low; @ 2.9 with afib & PACs/PVCs -- would ensure K+>4.0, therefore I have written for at least 1 extra dose of KDUR . On Abx for "sternal drainage"  Will need OP f/u established in [place] -- as mentioned yesterday, [facility] covers [place].  Marykay Lex, M.D., M.S.  [facility] [address] [place], [place]  [address]  [phone] Pager # [phone]  03/09/2012 2:25 PM

## 2012-03-10 DIAGNOSIS — IMO0001 Reserved for inherently not codable concepts without codable children: Secondary | ICD-10-CM | POA: Diagnosis present

## 2012-03-10 LAB — BASIC METABOLIC PANEL
CO2: >45 mEq/L (ref 19–32)
Calcium: 8.7 mg/dL (ref 8.4–10.5)
Chloride: 84 mEq/L — ABNORMAL LOW (ref 96–112)
Creatinine, Ser: 0.71 mg/dL (ref 0.50–1.10)
Glucose, Bld: 99 mg/dL (ref 70–99)

## 2012-03-10 LAB — BLOOD GAS, ARTERIAL
Bicarbonate: 42.6 mEq/L — ABNORMAL HIGH (ref 20.0–24.0)
O2 Saturation: 95.2 %
Patient temperature: 98.6
TCO2: 44.6 mmol/L (ref 0–100)
pH, Arterial: 7.435 — ABNORMAL HIGH (ref 7.350–7.400)

## 2012-03-10 LAB — GLUCOSE, CAPILLARY
Glucose-Capillary: 85 mg/dL (ref 70–99)
Glucose-Capillary: 115 mg/dL — ABNORMAL HIGH (ref 70–99)

## 2012-03-10 LAB — PRO B NATRIURETIC PEPTIDE: Pro B Natriuretic peptide (BNP): 2103 pg/mL — ABNORMAL HIGH (ref 0–125)

## 2012-03-10 LAB — VANCOMYCIN, TROUGH: Vancomycin Tr: 16 ug/mL (ref 10.0–20.0)

## 2012-03-10 MED ORDER — ACETAZOLAMIDE ER 500 MG PO CP12
500.0000 mg | ORAL_CAPSULE | Freq: Every day | ORAL | Status: DC
  Administered 2012-03-10 – 2012-03-15 (×6): 500 mg via ORAL
  Filled 2012-03-10 (×8): qty 1

## 2012-03-10 MED ORDER — VANCOMYCIN HCL 1000 MG IV SOLR
750.0000 mg | Freq: Two times a day (BID) | INTRAVENOUS | Status: DC
  Administered 2012-03-10 – 2012-03-14 (×9): 750 mg via INTRAVENOUS
  Filled 2012-03-10 (×13): qty 750

## 2012-03-10 MED ORDER — ALTEPLASE 2 MG IJ SOLR
2.0000 mg | Freq: Once | INTRAMUSCULAR | Status: AC
  Administered 2012-03-10: 2 mg
  Filled 2012-03-10: qty 2

## 2012-03-10 NOTE — Progress Notes (Signed)
CARDIAC REHAB PHASE I   PRE:  Rate/Rhythm: 79SR  BP:  Supine:   Sitting: 107/50  Standing:    SaO2: 95%2L  MODE:  Ambulation: 550 ft   POST:  Rate/Rhythem: 86SR  BP:  Supine: 114/53  Sitting:   Standing:    SaO2: 90-91%2L hall, 93%2L room 1000-1034 Pt walked 550 ft on 2L with rolling walker and asst x 1. To bathroom after walk. Checked sats in hallway with rest stop and sats at 90-91% on 2L. Pt states using IS and flutter valve. Discussed importance of increasing distance with walks. To bed after walk. Left on 2L.  Duanne Limerick

## 2012-03-10 NOTE — Progress Notes (Signed)
Pt ambulated 200 ft with RW and 2 L O2.  Gait unsteady, but better after prompting to refocus.  Complaint of leg "soreness."  Return to bed with call bell in reach, family at bedside.  Will con't plan of care.

## 2012-03-10 NOTE — Progress Notes (Signed)
Subjective:  Up in chair, some SOB this am.  Objective:  Vital Signs in the last 24 hours: Temp:  [97.8 F (36.6 C)-98.3 F (36.8 C)] 97.8 F (36.6 C) (05/30 0417) Pulse Rate:  [71-140] 71  (05/30 0417) Resp:  [18-24] 20  (05/30 0417) BP: (98-118)/(55-74) 98/56 mmHg (05/30 0417) SpO2:  [92 %-95 %] 94 % (05/30 0417) Weight:  [87 kg (191 lb 12.8 oz)] 87 kg (191 lb 12.8 oz) (05/30 0417)  Intake/Output from previous day:  Intake/Output Summary (Last 24 hours) at 03/10/12 0848 Last data filed at 03/09/12 2046  Gross per 24 hour  Intake    420 ml  Output    801 ml  Net   -381 ml    Physical Exam: General appearance: alert, cooperative, no distress and morbidly obese Lungs: insp/exp wheezing Heart: regular rate and rhythm    Rate: 75  Rhythm: normal sinus rhythm, periods of PAF  Lab Results:  Basename 03/09/12 0215 03/08/12 0400  WBC 13.4* 11.0*  HGB 8.8* 8.5*  PLT 195 170    Basename 03/10/12 0559 03/09/12 0215  NA 134* 132*  K 4.1 2.9*  CL 84* 81*  CO2 >45* >45*  GLUCOSE 99 100*  BUN 16 19  CREATININE 0.71 0.58   No results found for this basename: TROPONINI:2,CK,MB:2 in the last 72 hours Hepatic Function Panel No results found for this basename: PROT,ALBUMIN,AST,ALT,ALKPHOS,BILITOT,BILIDIR,IBILI in the last 72 hours No results found for this basename: CHOL in the last 72 hours No results found for this basename: INR in the last 72 hours  Imaging: Imaging results have been reviewed  Cardiac Studies:  Assessment/Plan:   Principal Problem:  *Exertional dyspnea Active Problems:  NSTEMI, Troponin 0.83 on admission to [facility]  S/P CABG x 2, 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 Stent at [facility] in 2001, cath 03/02/12 with 95% Lt. Main  COPD (chronic obstructive pulmonary disease)  PAF post op- Amiodarone  Pleural effusion, s/p thoracentesis 5/28  Anxiety disorder  GERD (gastroesophageal reflux disease)  Smoker, 2ppd  Obesity  Normal left  ventricular systolic function, pre op TEE   Plan- Will Rx with Xopenex this am.   Corine Shelter PA-C 03/10/2012, 8:48 AM    Agree with note written by Corine Shelter [facility]  Looks much improved. S/P urgent CABG for LM/3VD. POD #8. S/P left thoracentesis 500 cc with improved aeration and breathing. Exam benign. Labs OK except for a metabolic contraction alkalosis. Ambulating with CRH. Lower extremity edema improved. Continue IS and pulm toilet. Remains in NSR on amio.  Runell Gess 03/10/2012 10:51 AM

## 2012-03-10 NOTE — Consult Note (Signed)
Ms Fant states that she is a 1 ppd smoker who has quit once before. Unable to fully do the consult even though she wanted me due to pt falling asleep over an over. Informed pt i would be back tomorrow to revisit when she would be more awake. Referred to 1-800 quit now for f/u and support. Discussed oral fixation substitutes, second hand smoke and in home smoking policy. Reviewed and gave pt Written education/contact information.

## 2012-03-10 NOTE — Progress Notes (Addendum)
                    [address]            [place] [address]          [phone]     7 Days Post-Op Procedure(s) (LRB): CORONARY ARTERY BYPASS GRAFTING (CABG) (N/A)  Subjective: Feels well, no complaints.  Breathing stable.  Objective: Vital signs in last 24 hours: Patient Vitals for the past 24 hrs:  BP Temp Temp src Pulse Resp SpO2 Weight  03/10/12 0417 98/56 mmHg 97.8 F (36.6 C) Oral 71  20  94 % 191 lb 12.8 oz (87 kg)  03/09/12 2035 118/74 mmHg 98.2 F (36.8 C) Oral 89  24  92 % -  03/09/12 2000 - - - - - 95 % -  03/09/12 1424 104/65 mmHg 98.3 F (36.8 C) Oral 85  18  92 % -  03/09/12 1414 - - - - - 94 % -  03/09/12 1025 98/55 mmHg - - 140  20  - -  03/09/12 0820 - - - - - 94 % -   Current Weight  03/10/12 191 lb 12.8 oz (87 kg)  PRE OP WEIGHT: 88 kg    Intake/Output from previous day: 05/29 0701 - 05/30 0700 In: 420 [P.O.:420] Out: 801 [Urine:800; Stool:1] CBGs 82-80-99-85   PHYSICAL EXAM:  Heart: RRR Lungs: slightly decreased BS in bases, but clearer Wound: serosanguinous drainage from lower sternal wound with no surrounding erythema or instability Extremities: trace LE edema   Lab Results: CBC: Basename 03/09/12 0215 03/08/12 0400  WBC 13.4* 11.0*  HGB 8.8* 8.5*  HCT 27.3* 26.9*  PLT 195 170   BMET:  Basename 03/10/12 0559 03/09/12 0215  NA 134* 132*  K 4.1 2.9*  CL 84* 81*  CO2 >45* >45*  GLUCOSE 99 100*  BUN 16 19  CREATININE 0.71 0.58  CALCIUM 8.7 8.5    Pro BNP: 2103  Assessment/Plan: S/P Procedure(s) (LRB): CORONARY ARTERY BYPASS GRAFTING (CABG) (N/A) CV- had intermittent episodes AF overnight, now SR. Continue po Amio.  Not on beta blocker secondary to low BPs.  May need to consider low dose Lopressor if BP will tolerate. Vol overload/effusion- stable post thoracentesis.  Continue diuresis. Pulm- pulm toilet/IS, wean O2. Sternal wound drainage- continue Vanc/Avelox, local wound care. ABL anemia- continue  Fe. CRPI.   LOS: 8 days    COLLINS,GINA H 03/10/2012   sternal drainage is better   Metabolic alkalosis-hold lasix and give short course Diamox 500/d  L pl effusion- get CXR in am  patient examined and medical record reviewed,agree with above note. [name],[place] 03/10/2012

## 2012-03-11 ENCOUNTER — Inpatient Hospital Stay (HOSPITAL_COMMUNITY): Payer: Medicaid Other

## 2012-03-11 LAB — GLUCOSE, CAPILLARY
Glucose-Capillary: 112 mg/dL — ABNORMAL HIGH (ref 70–99)
Glucose-Capillary: 100 mg/dL — ABNORMAL HIGH (ref 70–99)
Glucose-Capillary: 119 mg/dL — ABNORMAL HIGH (ref 70–99)

## 2012-03-11 LAB — BASIC METABOLIC PANEL
Chloride: 86 mEq/L — ABNORMAL LOW (ref 96–112)
GFR calc Af Amer: >90 mL/min (ref >90)
GFR calc non Af Amer: >90 mL/min (ref >90)
Potassium: 2.9 mEq/L — ABNORMAL LOW (ref 3.5–5.1)
Sodium: 133 mEq/L — ABNORMAL LOW (ref 135–145)

## 2012-03-11 MED ORDER — CEPHALEXIN 500 MG PO CAPS: 500.0000 mg | ORAL_CAPSULE | Freq: Three times a day (TID) | ORAL | Status: DC

## 2012-03-11 MED ORDER — POTASSIUM CHLORIDE CRYS ER 20 MEQ PO TBCR
40.0000 meq | EXTENDED_RELEASE_TABLET | Freq: Two times a day (BID) | ORAL | Status: AC
  Administered 2012-03-11 (×2): 40 meq via ORAL
  Filled 2012-03-11 (×2): qty 2

## 2012-03-11 MED ORDER — ASPIRIN 325 MG PO TBEC: 325.0000 mg | DELAYED_RELEASE_TABLET | Freq: Every day | ORAL | Status: DC

## 2012-03-11 MED ORDER — NICOTINE 21 MG/24HR TD PT24: 1.0000 | MEDICATED_PATCH | Freq: Every day | TRANSDERMAL | Status: DC

## 2012-03-11 MED ORDER — OXYCODONE HCL 5 MG PO TABS: 5.0000 mg | ORAL_TABLET | ORAL | Status: AC | PRN

## 2012-03-11 MED ORDER — SIMVASTATIN 20 MG PO TABS: 20.0000 mg | ORAL_TABLET | Freq: Every day | ORAL | Status: DC

## 2012-03-11 MED ORDER — POLYSACCHARIDE IRON COMPLEX 150 MG PO CAPS: 150.0000 mg | ORAL_CAPSULE | Freq: Every day | ORAL | Status: AC

## 2012-03-11 MED ORDER — AMIODARONE HCL 400 MG PO TABS: ORAL_TABLET | ORAL | Status: DC

## 2012-03-11 MED FILL — Heparin Sodium (Porcine) Inj 1000 Unit/ML: INTRAMUSCULAR | Qty: 30 | Status: AC

## 2012-03-11 MED FILL — Mannitol IV Soln 20%: INTRAVENOUS | Qty: 500 | Status: AC

## 2012-03-11 MED FILL — Electrolyte-R (PH 7.4) Solution: INTRAVENOUS | Qty: 3000 | Status: AC

## 2012-03-11 MED FILL — Lidocaine HCl IV Inj 20 MG/ML: INTRAVENOUS | Qty: 5 | Status: AC

## 2012-03-11 MED FILL — Heparin Sodium (Porcine) Inj 1000 Unit/ML: INTRAMUSCULAR | Qty: 10 | Status: AC

## 2012-03-11 MED FILL — Sodium Bicarbonate IV Soln 8.4%: INTRAVENOUS | Qty: 50 | Status: AC

## 2012-03-11 MED FILL — Sodium Chloride Irrigation Soln 0.9%: Qty: 3000 | Status: AC

## 2012-03-11 MED FILL — Sodium Chloride IV Soln 0.9%: INTRAVENOUS | Qty: 1000 | Status: AC

## 2012-03-11 NOTE — Progress Notes (Signed)
EPWs were pulled per protocol, no ectopy or other problem noted at this time.  Instructed on need for one hour of bedrest.  Will continue to monitor.

## 2012-03-11 NOTE — Consult Note (Signed)
Revisiting Ms. Deschene who was too sleepy to continue the consult with. Today she is alert and awake. She was a 2 ppd smoker before and is in action state. She says she is using the patch now but after d/c she wants to quit on her own cold [place]. No pharmacotherapy at this time but if she feels she'll need it she will contact and f/u with me. Referred to 1-800 quit now for f/u and support. Discussed oral fixation substitutes, second hand smoke and in home smoking policy. Reviewed and gave pt Written education/contact information.

## 2012-03-11 NOTE — Progress Notes (Signed)
Monitor tech informed the nurse  @ 0250, that the patient's HR was between 120 -140's ST. The patient sustained ST for about an hour then converted to a-fib @ 0323 with HR between 120-130. Nurse checked on the patient and she was asymptomatic. [name]

## 2012-03-11 NOTE — Progress Notes (Signed)
Lab called nurse to issue a critical lab of CO2 41. The patient has been trending in the lower 40's on the 5/27 - 40, 5/28 - 45 , 5/29- 45, 5/30 - 45. [name]

## 2012-03-11 NOTE — Progress Notes (Signed)
                  [address]            [place] [address]          [phone]            Discharge Summary  Name: Victoria Bell  DOB: 09/14/1961 51 y.o.  MRN: [id number]   Admission Date: 03/02/2012  Discharge Date:     Admitting Diagnosis:  Chest pain  Shortness of breath    Discharge Diagnosis:  Non-ST segment elevation myocardial infarction  Left main and severe coronary artery disease  Hypertension  Anxiety  Gastroesophageal reflux disease  Type 2 diabetes mellitus  History of myocardial infarction in 2001  History of tobacco abuse  Postoperative atrial fibrillation  Postoperative contraction alkalosis  Left pleural effusion  Sternal wound drainage    Procedures:  Procedure(s):  CORONARY ARTERY BYPASS GRAFTING x 2 (left internal mammary artery to the LAD, saphenous vein graft to the circumflex ), EVH bilateral thighs - 03/03/2012 Ultrasound-guided left thoracentesis -03/08/2012   HPI: The patient is a 51 y.o. female with a history of coronary artery disease, status post MI about 12 years ago, treated at [facility]. The last 2 months, she has noticed exertional dyspnea associated with angina. She has not been followed by her cardiologist or any other medical physician due to lack of insurance. On the day of admission, she went to visit her mother in [place] [facility] and a nurse noticed that she was wheezing. She was given a breathing treatment, aspirin and evaluated in the hospital. The patient was found to have elevated troponins and was transferred to [place] for cardiology work up, as there were no beds available at the hospitals surrounding [place].    Hospital Course: The patient was admitted to  on 03/02/2012.  She was seen by cardiology and ruled in for a non-ST segment elevation myocardial infarction. She underwent cardiac catheterization by Dr. Berry on 03/02/2012, which showed high grade distal left  main disease and 95% with preserved left ventricular function. A cardiac surgery consultation was requested and the patient was seen by Dr. Peter Van Trigt. After review of her films, Dr. Van Trigt felt to benefit from urgent surgical revascularization.  All risks, benefits and alternatives of surgery were explained in detail, and the patient agreed to proceed. The patient was taken to the operating room and underwent the above procedure.  The postoperative course was notable for a brief episode of nonsustained atrial fibrillation. She was started on oral amiodarone and has remained in sinus rhythm. She developed drainage from the lower portion of her sternum which was serosanguineous in nature. She started empirically on IV antibiotics. She was somewhat volume overloaded postoperatively and was started on Lasix. Despite aggressive diuresis, she had a persistent left pleural effusion with shortness of breath and decreased O2 sats. She ultimately underwent an ultrasound-guided thoracentesis in radiology on 03/08/2012 which yielded 500 cc of serosanguineous fluid. She developed a metabolic contraction alkalosis which is thought to be secondary to diuresis. Her Lasix was discontinued and she was treated with Diamox. She was initially started on a beta blocker postoperatively, however she developed hypotension with systolic blood pressures in the 70s to 90s and this was discontinued. Her blood pressures are slowly improving but still remain around 100 systolic, therefore she has not been restarted on a beta blocker or an ACE inhibitor.  Overall, she   is progressing well. She is ambulating with cardiac rehab and tolerating a regular diet. She has remained afebrile and vital signs have been stable. Her sternal drainage is improving and there is no surrounding erythema or sternal instability. She has been counseled regarding smoking cessation. She is being weaned from oxygen. We anticipate discharge home in the next 48  hours, provided she continues to progress and no acute changes occur.    Recent vital signs:  Filed Vitals:    03/11/12 0302   BP:  103/63   Pulse:  82   Temp:    Resp:  18      Recent laboratory studies:  CBC:  Basename  03/09/12 0215   WBC  13.4*   HGB  8.8*   HCT  27.3*   PLT  195    BMET:  Basename  03/11/12 0428  03/10/12 0559   NA  133*  134*   K  2.9*  4.1   CL  86*  84*   CO2  41*  >45*   GLUCOSE  119*  99   BUN  11  16   CREATININE  0.69  0.71   CALCIUM  8.7  8.7    PT/INR: No results found for this basename: LABPROT,INR in the last 72 hours     Discharge Medications:  Medication List  As of 03/11/2012 9:51 AM    TAKE these medications          ALPRAZolam 1 MG tablet      Commonly known as: XANAX      Take 1.5 mg by mouth 3 (three) times daily.      amiodarone 400 MG tablet      Commonly known as: PACERONE      400 mg po bid x 1 week, then 200 mg po bid      aspirin 325 MG EC tablet      Take 1 tablet (325 mg total) by mouth daily.      cephALEXin 500 MG capsule      Commonly known as: KEFLEX      Take 1 capsule (500 mg total) by mouth 3 (three) times daily. X 1 week      iron polysaccharides 150 MG capsule      Commonly known as: NIFEREX      Take 1 capsule (150 mg total) by mouth daily.      nicotine 21 mg/24hr patch      Commonly known as: NICODERM CQ - dosed in mg/24 hours      Place 1 patch onto the skin daily.      oxyCODONE 5 MG immediate release tablet      Commonly known as: Oxy IR/ROXICODONE      Take 1-2 tablets (5-10 mg total) by mouth every 3 (three) hours as needed for pain.      simvastatin 20 MG tablet      Commonly known as: ZOCOR      Take 1 tablet (20 mg total) by mouth at bedtime.         Discharge Instructions: The patient is to refrain from driving, heavy lifting or strenuous activity. May shower daily and clean incisions with soap and water. May resume regular diet.     Discharge Orders    Future Appointments:   Provider:  Department:  Dept Phone:  Center:    04/06/2012 11:30 AM  Peter Van Trigt, MD  [facility]  [phone]  [facility]      

## 2012-03-11 NOTE — Progress Notes (Addendum)
[address]            [place] [address]          [phone]     8 Days Post-Op  Procedure(s) (LRB): CORONARY ARTERY BYPASS GRAFTING (CABG) (N/A) Subjective: Feels a little better  Objective  Telemetry afib, converted back to SR at 7:30  Temp:  [98.5 F (36.9 C)] 98.5 F (36.9 C) (05/30 1947) Pulse Rate:  [72-82] 82  (05/31 0302) Resp:  [18] 18  (05/31 0302) BP: (92-123)/(59-73) 103/63 mmHg (05/31 0302) SpO2:  [96 %-98 %] 96 % (05/30 1947) Weight:  [191 lb 12.8 oz (87 kg)] 191 lb 12.8 oz (87 kg) (05/31 0436)   Intake/Output Summary (Last 24 hours) at 03/11/12 0808 Last data filed at 03/10/12 2213  Gross per 24 hour  Intake    610 ml  Output    201 ml  Net    409 ml       General appearance: alert, cooperative and no distress Heart: regular rate and rhythm and S1, S2 normal Lungs: diminished in bases Abdomen: soft, non-tender Extremities: mild edema Wound: incisions healing well, small amt of drainage from lower pole of sternal incision  Lab Results:  Basename 03/11/12 0428 03/10/12 0559  NA 133* 134*  K 2.9* 4.1  CL 86* 84*  CO2 41* >45*  GLUCOSE 119* 99  BUN 11 16  CREATININE 0.69 0.71  CALCIUM 8.7 8.7  MG -- --  PHOS -- --   No results found for this basename: AST:2,ALT:2,ALKPHOS:2,BILITOT:2,PROT:2,ALBUMIN:2 in the last 72 hours No results found for this basename: LIPASE:2,AMYLASE:2 in the last 72 hours  Basename 03/09/12 0215  WBC 13.4*  NEUTROABS --  HGB 8.8*  HCT 27.3*  MCV 93.5  PLT 195   No results found for this basename: CKTOTAL:4,CKMB:4,TROPONINI:4 in the last 72 hours No components found with this basename: POCBNP:3 No results found for this basename: DDIMER in the last 72 hours No results found for this basename: HGBA1C in the last 72 hours No results found for this basename: CHOL,HDL,LDLCALC,TRIG,CHOLHDL in the last 72 hours No results found for this basename: TSH,T4TOTAL,FREET3,T3FREE,THYROIDAB  in the last 72 hours No results found for this basename: VITAMINB12,FOLATE,FERRITIN,TIBC,IRON,RETICCTPCT in the last 72 hours  Medications: Scheduled    . acetaZOLAMIDE  500 mg Oral Daily  . ALPRAZolam  1.5 mg Oral BID  . alteplase  2 mg Intracatheter Once  . amiodarone  400 mg Oral Q12H  . aspirin EC  325 mg Oral Daily   Or  . aspirin  324 mg Per Tube Daily  . bisacodyl  10 mg Oral Daily   Or  . bisacodyl  10 mg Rectal Daily  . budesonide  0.5 mg Nebulization BID  . dextromethorphan-guaiFENesin  1 tablet Oral BID  . docusate sodium  200 mg Oral Daily  . insulin aspart  0-24 Units Subcutaneous TID AC & HS  . iron polysaccharides  150 mg Oral Daily  . levalbuterol  0.63 mg Nebulization TID BM  . nicotine  21 mg Transdermal Daily  . pantoprazole  40 mg Oral Q1200  . simvastatin  20 mg Oral q1800  . sodium chloride  10-40 mL Intracatheter Q12H  . vancomycin  750 mg Intravenous Q12H  . DISCONTD: furosemide  40 mg Oral Daily  . DISCONTD: moxifloxacin  400 mg Oral q1800  . DISCONTD: potassium chloride  40 mEq Oral BID  . DISCONTD: vancomycin  1,000 mg Intravenous Q12H     Radiology/Studies:  Dg Chest 2 View  03/11/2012  *RADIOLOGY REPORT*  Clinical Data: Shortness of breath and chest pain.  CHEST - 2 VIEW  Comparison: 03/09/2012  Findings: Right arm PICC line tip is near the cavoatrial junction. There are pleural effusions, left side greater than right. Slightly enlarged central vascular structures may represent vascular congestion.  No evidence for frank pulmonary edema.  No evidence for a pneumothorax. Heart size is stable.  Median sternotomy wires are grossly intact.  Epicardial pacer wires are present.  IMPRESSION: Stable chest radiograph findings.  Persistent small pleural effusions, left side greater than right.  Suspect mild vascular congestion.  Original Report Authenticated By: Richarda Overlie, M.D.    INR: Will add last result for INR, ABG once components are confirmed Will add  last 4 CBG results once components are confirmed  Assessment/Plan: S/P Procedure(s) (LRB): CORONARY ARTERY BYPASS GRAFTING (CABG) (N/A)  1. afib- cont amio- replace K+ 2 CO2- improved, cont Diamox for now 3 cont vanco 4 cont rehab/pulm toilet  LOS: 9 days    Victoria Bell,Victoria Bell 5/31/20138:08 AM    patient examined and medical record reviewed,agree with above note. [name],Arnet Hofferber 03/11/2012

## 2012-03-11 NOTE — Progress Notes (Signed)
CARDIAC REHAB PHASE I   PRE:  Rate/Rhythm: 83SR  BP:  Supine:   Sitting: 102/50  Standing:    SaO2: 93%2L  MODE:  Ambulation: 690 ft   POST:  Rate/Rhythem: 89SR  BP:  Supine:   Sitting: 114/50  Standing:    SaO2: 82%RA at 125 ft, put on 2L and sats to 90%, 95%2L room 1355-1435 Pt walked 666ft with rolling walker and asst x 1. Walked 125 ft and desat to 82%RA. Walked 565 ft on 2L and sats above 90%. To bed with call bell. Left on 2L.  Duanne Limerick

## 2012-03-11 NOTE — Progress Notes (Signed)
[facility]  Subjective: Feeling better.  Occassionally she gets a twinge of CP above her right breast and quickly resolves.  Breathing better.  No difficulty with ambulation this AM.  Objective: Vital signs in last 24 hours: Temp:  [98.5 F (36.9 C)] 98.5 F (36.9 C) (05/30 1947) Pulse Rate:  [72-82] 82  (05/31 0302) Resp:  [18] 18  (05/31 0302) BP: (92-123)/(59-73) 103/63 mmHg (05/31 0302) SpO2:  [96 %] 96 % (05/30 1947) Weight:  [87 kg (191 lb 12.8 oz)] 87 kg (191 lb 12.8 oz) (05/31 0436) Last BM Date: 03/09/12  Intake/Output from previous day: 05/30 0701 - 05/31 0700 In: 850 [P.O.:840; I.V.:10] Out: 201 [Urine:200; Stool:1] Intake/Output this shift:    Medications Current Facility-Administered Medications  Medication Dose Route Frequency Provider Last Rate Last Dose  . acetaZOLAMIDE (DIAMOX) 12 hr capsule 500 mg  500 mg Oral Daily Rowe Clack, PA   500 mg at 03/10/12 1712  . ALPRAZolam Prudy Feeler) tablet 1.5 mg  1.5 mg Oral BID Kerin Perna, MD   1.5 mg at 03/10/12 2212  . alteplase (CATHFLO ACTIVASE) injection 2 mg  2 mg Intracatheter Once Kerin Perna, MD   2 mg at 03/10/12 1501  . amiodarone (PACERONE) tablet 400 mg  400 mg Oral Q12H Kerin Perna, MD   400 mg at 03/10/12 2212  . aspirin EC tablet 325 mg  325 mg Oral Daily Ardelle Balls, [place]   325 mg at 03/10/12 1610   Or  . aspirin chewable tablet 324 mg  324 mg Per Tube Daily Donielle Margaretann Loveless, PA      . bisacodyl (DULCOLAX) EC tablet 10 mg  10 mg Oral Daily Ardelle Balls, PA   10 mg at 03/06/12 9604   Or  . bisacodyl (DULCOLAX) suppository 10 mg  10 mg Rectal Daily Ardelle Balls, PA      . budesonide (PULMICORT) nebulizer solution 0.5 mg  0.5 mg Nebulization BID Kerin Perna, MD   0.5 mg at 03/11/12 0857  . dextromethorphan-guaiFENesin (MUCINEX DM) 30-600 MG per 12 hr tablet 1 tablet  1 tablet Oral BID Kerin Perna, MD   1 tablet at 03/10/12 2212  .  docusate sodium (COLACE) capsule 200 mg  200 mg Oral Daily Ardelle Balls, PA   200 mg at 03/09/12 1006  . insulin aspart (novoLOG) injection 0-24 Units  0-24 Units Subcutaneous TID AC & HS Kerin Perna, MD      . iron polysaccharides (NIFEREX) capsule 150 mg  150 mg Oral Daily Wilmon Pali, PA   150 mg at 03/10/12 0959  . levalbuterol (XOPENEX) nebulizer solution 0.63 mg  0.63 mg Nebulization Q6H PRN Abelino Derrick, PA   0.63 mg at 03/07/12 0430  . levalbuterol (XOPENEX) nebulizer solution 0.63 mg  0.63 mg Nebulization TID BM Kerin Perna, MD   0.63 mg at 03/11/12 0856  . metoprolol (LOPRESSOR) injection 2.5-5 mg  2.5-5 mg Intravenous Q2H PRN Ardelle Balls, PA   5 mg at 03/09/12 2151  . nicotine (NICODERM CQ - dosed in mg/24 hours) patch 21 mg  21 mg Transdermal Daily Rometta Emery, MD   21 mg at 03/10/12 1000  . ondansetron (ZOFRAN) injection 4 mg  4 mg Intravenous Q6H PRN Ardelle Balls, PA   4 mg at 03/10/12 2341  . oxyCODONE (Oxy IR/ROXICODONE) immediate release tablet 5-10 mg  5-10 mg Oral Q3H PRN  Ardelle Balls, PA   5 mg at 03/10/12 2213  . pantoprazole (PROTONIX) EC tablet 40 mg  40 mg Oral Q1200 Ardelle Balls, PA   40 mg at 03/10/12 1148  . potassium chloride SA (K-DUR,KLOR-CON) CR tablet 40 mEq  40 mEq Oral BID Rowe Clack, PA      . simvastatin (ZOCOR) tablet 20 mg  20 mg Oral q1800 Kerin Perna, MD   20 mg at 03/10/12 1712  . sodium chloride 0.9 % injection 10-40 mL  10-40 mL Intracatheter Q12H Kerin Perna, MD   10 mL at 03/10/12 2213  . sodium chloride 0.9 % injection 10-40 mL  10-40 mL Intracatheter PRN Kerin Perna, MD   10 mL at 03/11/12 0750  . sodium chloride 0.9 % injection 3 mL  3 mL Intravenous PRN Ardelle Balls, PA      . traMADol Janean Sark) tablet 50 mg  50 mg Oral Q6H PRN Kerin Perna, MD   50 mg at 03/08/12 0842  . vancomycin (VANCOCIN) 750 mg in sodium chloride 0.9 % 150 mL IVPB  750 mg Intravenous Q12H Kerin Perna, MD   750 mg at 03/10/12 2210  . DISCONTD: furosemide (LASIX) tablet 40 mg  40 mg Oral Daily Rowe Clack, [place]      . DISCONTD: moxifloxacin (AVELOX) tablet 400 mg  400 mg Oral q1800 Kerin Perna, MD   400 mg at 03/09/12 1711  . DISCONTD: potassium chloride SA (K-DUR,KLOR-CON) CR tablet 40 mEq  40 mEq Oral BID Wilmon Pali, PA   40 mEq at 03/09/12 2145  . DISCONTD: vancomycin (VANCOCIN) IVPB 1000 mg/200 mL premix  1,000 mg Intravenous Q12H Kerin Perna, MD   1,000 mg at 03/10/12 1050    PE: General appearance: alert, cooperative and no distress Lungs: Decreased BS on the L > R.  No Wheeze or rales Heart: regular rate and rhythm, S1, S2 normal, no murmur, click, rub or gallop Extremities: 1+ LEE. Pulses: Radials 2+ and symmetric Skin: Skin color, texture, turgor normal. No rashes or lesions or Warm and dry  Lab Results:   [facility] 03/09/12 0215  WBC 13.4*  HGB 8.8*  HCT 27.3*  PLT 195   BMET  Basename 03/11/12 0428 03/10/12 0559 03/09/12 0215  NA 133* 134* 132*  K 2.9* 4.1 2.9*  CL 86* 84* 81*  CO2 41* >45* >45*  GLUCOSE 119* 99 100*  BUN 11 16 19   CREATININE 0.69 0.71 0.58  CALCIUM 8.7 8.7 8.5   CHEST - 2 VIEW  Comparison: 03/09/2012  Findings: Right arm PICC line tip is near the cavoatrial junction.  There are pleural effusions, left side greater than right.  Slightly enlarged central vascular structures may represent  vascular congestion. No evidence for frank pulmonary edema. No  evidence for a pneumothorax. Heart size is stable. Median  sternotomy wires are grossly intact. Epicardial pacer wires are  present.  IMPRESSION:  Stable chest radiograph findings. Persistent small pleural  effusions, left side greater than right.  Suspect mild vascular congestion.  Assessment/Plan  Principal Problem:  *Exertional dyspnea Active Problems:  Anxiety disorder  CAD, ? Stent at [facility] in 2001, cath 03/02/12 with 95% Lt. 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 POD #9. S/P left thoracentesis(5/28) 500 cc with improved aeration and breathing.  BP 92/59 - 123/73. Sinus rhythm on telemetry.  O2 sats are 82% on room air(She had just come back from the bathroom.).  Potassium 2.9-being repleted.     LOS: 9 days    HAGER, BRYAN 03/11/2012 9:54 AM    Patient seen and examined. Agree with assessment and plan. Pt feels improved. Breathing better. No sign of edema presently. Continues to desaturate oxygen with walking. Rec MG with persistent hypokalemia and replete if low.  Anticipate DC this weekend.   Lennette Bihari, MD, [facility] 03/11/2012 4:56 PM

## 2012-03-12 LAB — GLUCOSE, CAPILLARY

## 2012-03-12 LAB — BASIC METABOLIC PANEL
BUN: 10 mg/dL (ref 6–23)
Chloride: 90 mEq/L — ABNORMAL LOW (ref 96–112)
GFR calc Af Amer: >90 mL/min (ref >90)
GFR calc non Af Amer: >90 mL/min (ref >90)
Glucose, Bld: 117 mg/dL — ABNORMAL HIGH (ref 70–99)
Potassium: 3.7 mEq/L (ref 3.5–5.1)
Sodium: 133 mEq/L — ABNORMAL LOW (ref 135–145)

## 2012-03-12 NOTE — Progress Notes (Addendum)
                    [address]            [place] [address]          [phone]     9 Days Post-Op Procedure(s) (LRB): CORONARY ARTERY BYPASS GRAFTING (CABG) (N/A)  Subjective: Feels better.  Breathing stable.  Walked already this am.  Objective: Vital signs in last 24 hours: Patient Vitals for the past 24 hrs:  BP Temp Temp src Pulse Resp SpO2 Weight  03/12/12 0818 - - - - - 95 % -  03/12/12 0500 - - - - - - 194 lb 9.6 oz (88.27 kg)  03/12/12 0453 98/57 mmHg 97.9 F (36.6 C) Oral 77  19  95 % -  03/11/12 2112 - - - - - 93 % -  03/11/12 2110 92/51 mmHg 98.5 F (36.9 C) Oral 79  20  97 % -  03/11/12 1444 - - - - - 93 % -  03/11/12 1353 124/76 mmHg 98.7 F (37.1 C) Oral 79  18  93 % -   Current Weight  03/12/12 194 lb 9.6 oz (88.27 kg)     Intake/Output from previous day: 05/31 0701 - 06/01 0700 In: 720 [P.O.:720] Out: 650 [Urine:650]    PHYSICAL EXAM:  Heart:RRR  Lungs: clear, no wheezes Wound: clean and dry Extremities: +LE edema  Lab Results: CBC:No results found for this basename: WBC:2,HGB:2,HCT:2,PLT:2 in the last 72 hours BMET:  [facility] 03/12/12 0517 03/11/12 0428  NA 133* 133*  K 3.7 2.9*  CL 90* 86*  CO2 36* 41*  GLUCOSE 117* 119*  BUN 10 11  CREATININE 0.73 0.69  CALCIUM 8.6 8.7    PT/INR: No results found for this basename: LABPROT,INR in the last 72 hours   Assessment/Plan: S/P Procedure(s) (LRB): CORONARY ARTERY BYPASS GRAFTING (CABG) (N/A) CV- AF, now maintaining SR.  Continue Amio.  Off bb due to hypotension. Sternal wound stable, no drainage.  Will continue Vanc and change to po Keflex at discharge. Metabolic contraction alkalosis- CO2 improving,  Continue Diamox. Vol overload- diurese. Pulm- continue pulm toilet.  Will need home O2. Hopefully home in am if remains stable.   LOS: 10 days    COLLINS,GINA H 03/12/2012    Chart reviewed, patient examined, agree with above.

## 2012-03-12 NOTE — Progress Notes (Signed)
CARDIAC REHAB PHASE I   PRE:  Rate/Rhythm: 78 SR  BP:  Supine: 92/62  Sitting:   Standing:    SaO2: 96% 2L  MODE:  Ambulation: 550 ft   POST:  Rate/Rhythem: 84  BP:  Supine: 102/62  Sitting:   Standing:    SaO2: 96% 2L  1225-1325 pt tolerated ambulation fair with assist x1 and pushing the rolling walker. To bed after walk, no c/o, VSS. Reviewed CABG d/c instructions with patient and her family. Discussed Phase II CR and permission given to send her contact information to the Cardiac Rehab program at [facility].  [place], [place]

## 2012-03-12 NOTE — Progress Notes (Signed)
Pt ambulated 350 ft with assistance and tolerated activity well. Will continue to monitor.

## 2012-03-12 NOTE — Progress Notes (Signed)
[facility]  Subjective: Her legs are sore.  Ambulating well.  Objective: Vital signs in last 24 hours: Temp:  [97.9 F (36.6 C)-98.7 F (37.1 C)] 97.9 F (36.6 C) (06/01 0453) Pulse Rate:  [77-79] 77  (06/01 0453) Resp:  [18-20] 19  (06/01 0453) BP: (92-124)/(51-76) 98/57 mmHg (06/01 0453) SpO2:  [93 %-97 %] 95 % (06/01 0818) Weight:  [88.27 kg (194 lb 9.6 oz)] 88.27 kg (194 lb 9.6 oz) (06/01 0500) Last BM Date: 03/09/12  Intake/Output from previous day: 05/31 0701 - 06/01 0700 In: 720 [P.O.:720] Out: 650 [Urine:650] Intake/Output this shift:    Medications Current Facility-Administered Medications  Medication Dose Route Frequency Provider Last Rate Last Dose  . acetaZOLAMIDE (DIAMOX) 12 hr capsule 500 mg  500 mg Oral Daily Rowe Clack, PA   500 mg at 03/11/12 1024  . ALPRAZolam Prudy Feeler) tablet 1.5 mg  1.5 mg Oral BID Kerin Perna, MD   1.5 mg at 03/11/12 2153  . amiodarone (PACERONE) tablet 400 mg  400 mg Oral Q12H Kerin Perna, MD   400 mg at 03/11/12 2152  . aspirin EC tablet 325 mg  325 mg Oral Daily Ardelle Balls, [place]   325 mg at 03/11/12 1024   Or  . aspirin chewable tablet 324 mg  324 mg Per Tube Daily Donielle Margaretann Loveless, PA      . bisacodyl (DULCOLAX) EC tablet 10 mg  10 mg Oral Daily Ardelle Balls, PA   10 mg at 03/06/12 1610   Or  . bisacodyl (DULCOLAX) suppository 10 mg  10 mg Rectal Daily Ardelle Balls, PA      . budesonide (PULMICORT) nebulizer solution 0.5 mg  0.5 mg Nebulization BID Kerin Perna, MD   0.5 mg at 03/12/12 0816  . dextromethorphan-guaiFENesin (MUCINEX DM) 30-600 MG per 12 hr tablet 1 tablet  1 tablet Oral BID Kerin Perna, MD   1 tablet at 03/11/12 2153  . docusate sodium (COLACE) capsule 200 mg  200 mg Oral Daily Ardelle Balls, PA   200 mg at 03/11/12 1024  . insulin aspart (novoLOG) injection 0-24 Units  0-24 Units Subcutaneous TID AC & HS Kerin Perna, MD      . iron  polysaccharides (NIFEREX) capsule 150 mg  150 mg Oral Daily Wilmon Pali, PA   150 mg at 03/11/12 1024  . levalbuterol (XOPENEX) nebulizer solution 0.63 mg  0.63 mg Nebulization Q6H PRN Abelino Derrick, PA   0.63 mg at 03/07/12 0430  . levalbuterol (XOPENEX) nebulizer solution 0.63 mg  0.63 mg Nebulization TID BM Kerin Perna, MD   0.63 mg at 03/12/12 0817  . metoprolol (LOPRESSOR) injection 2.5-5 mg  2.5-5 mg Intravenous Q2H PRN Ardelle Balls, PA   5 mg at 03/09/12 2151  . nicotine (NICODERM CQ - dosed in mg/24 hours) patch 21 mg  21 mg Transdermal Daily Rometta Emery, MD   21 mg at 03/11/12 1025  . ondansetron (ZOFRAN) injection 4 mg  4 mg Intravenous Q6H PRN Ardelle Balls, PA   4 mg at 03/10/12 2341  . oxyCODONE (Oxy IR/ROXICODONE) immediate release tablet 5-10 mg  5-10 mg Oral Q3H PRN Ardelle Balls, PA   5 mg at 03/11/12 2210  . pantoprazole (PROTONIX) EC tablet 40 mg  40 mg Oral Q1200 Ardelle Balls, PA   40 mg at 03/11/12 1237  . potassium chloride SA (K-DUR,KLOR-CON) CR  tablet 40 mEq  40 mEq Oral BID Rowe Clack, PA   40 mEq at 03/11/12 2152  . simvastatin (ZOCOR) tablet 20 mg  20 mg Oral q1800 Kerin Perna, MD   20 mg at 03/11/12 1729  . sodium chloride 0.9 % injection 10-40 mL  10-40 mL Intracatheter Q12H Kerin Perna, MD   10 mL at 03/10/12 2213  . sodium chloride 0.9 % injection 10-40 mL  10-40 mL Intracatheter PRN Kerin Perna, MD   10 mL at 03/12/12 0521  . sodium chloride 0.9 % injection 3 mL  3 mL Intravenous PRN Ardelle Balls, PA      . traMADol Janean Sark) tablet 50 mg  50 mg Oral Q6H PRN Kerin Perna, MD   50 mg at 03/12/12 0311  . vancomycin (VANCOCIN) 750 mg in sodium chloride 0.9 % 150 mL IVPB  750 mg Intravenous Q12H Kerin Perna, MD   750 mg at 03/11/12 2214    PE: General appearance: alert, cooperative and no distress Lungs: decreased BS particularly at the left base.  No wheeze or rales. Heart: regular rate and rhythm,  S1, S2 normal, no murmur, click, rub or gallop Extremities: 1+ LEE Pulses: 2+ and symmetric Skin: warm and dry  Lab Results:  No results found for this basename: WBC:3,HGB:3,HCT:3,PLT:3 in the last 72 hours BMET  [facility] 03/12/12 0517 03/11/12 0428 03/10/12 0559  NA 133* 133* 134*  K 3.7 2.9* 4.1  CL 90* 86* 84*  CO2 36* 41* >45*  GLUCOSE 117* 119* 99  BUN 10 11 16   CREATININE 0.73 0.69 0.71  CALCIUM 8.6 8.7 8.7    Assessment/Plan  Principal Problem:  *Exertional dyspnea Active Problems:  Anxiety disorder  CAD, ? Stent at [facility] in 2001, cath 03/02/12 with 95% Lt. 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 POD #9. S/P left thoracentesis(5/28) 500 cc with improved aeration and breathing. BP 92/51 - 124/76. Sinus rhythm on telemetry.  Magnesium 2.0 and Potassium improve to 3.7.  Amio 400mg  BID/ASA/zocor. Will have Home Health and O2.    LOS: 10 days    HAGER, BRYAN 03/12/2012 10:28 AM   Patient seen and examined. Agree with assessment and plan. No further AF; maintaining sinus rhythm. Contraction alkalosis improving on diamox. Mild cough. Continue pulmonary toilet. For DC probably tomorrow.  Cardiac rehab post DC.   Lennette Bihari, MD, [facility] 03/12/2012 10:50 AM

## 2012-03-13 ENCOUNTER — Inpatient Hospital Stay (HOSPITAL_COMMUNITY): Payer: Medicaid Other

## 2012-03-13 LAB — BASIC METABOLIC PANEL
BUN: 9 mg/dL (ref 6–23)
CO2: 34 mEq/L — ABNORMAL HIGH (ref 19–32)
GFR calc non Af Amer: >90 mL/min (ref >90)
Glucose, Bld: 116 mg/dL — ABNORMAL HIGH (ref 70–99)
Potassium: 3.4 mEq/L — ABNORMAL LOW (ref 3.5–5.1)

## 2012-03-13 LAB — BLOOD GAS, ARTERIAL
Acid-Base Excess: 8.3 mmol/L — ABNORMAL HIGH (ref 0.0–2.0)
Bicarbonate: 34.5 mEq/L — ABNORMAL HIGH (ref 20.0–24.0)
O2 Content: 2.5 L/min
O2 Saturation: 98.6 %
Patient temperature: 98.6
TCO2: 36.7 mmol/L (ref 0–100)
pCO2 arterial: 70.7 mmHg (ref 35.0–45.0)
pH, Arterial: 7.31 — ABNORMAL LOW (ref 7.350–7.400)
pO2, Arterial: 83.5 mmHg (ref 80.0–100.0)

## 2012-03-13 LAB — GLUCOSE, CAPILLARY: Glucose-Capillary: 111 mg/dL — ABNORMAL HIGH (ref 70–99)

## 2012-03-13 MED ORDER — SPIRONOLACTONE 25 MG PO TABS
25.0000 mg | ORAL_TABLET | Freq: Every day | ORAL | Status: DC
  Administered 2012-03-13 – 2012-03-15 (×3): 25 mg via ORAL
  Filled 2012-03-13 (×3): qty 1

## 2012-03-13 MED ORDER — POTASSIUM CHLORIDE CRYS ER 20 MEQ PO TBCR
20.0000 meq | EXTENDED_RELEASE_TABLET | Freq: Two times a day (BID) | ORAL | Status: DC
  Administered 2012-03-13 (×2): 20 meq via ORAL
  Filled 2012-03-13 (×4): qty 1

## 2012-03-13 MED ORDER — FUROSEMIDE 40 MG PO TABS
40.0000 mg | ORAL_TABLET | Freq: Every day | ORAL | Status: DC
  Administered 2012-03-13 – 2012-03-15 (×3): 40 mg via ORAL
  Filled 2012-03-13 (×4): qty 1

## 2012-03-13 NOTE — Progress Notes (Addendum)
                    [address]            [place] [address]          [phone]     10 Days Post-Op Procedure(s) (LRB): CORONARY ARTERY BYPASS GRAFTING (CABG) (N/A)  Subjective: Feels better, no complaints.  Objective: Vital signs in last 24 hours: Patient Vitals for the past 24 hrs:  BP Temp Temp src Pulse Resp SpO2 Weight  03/13/12 0753 - - - - - 98 % -  03/13/12 0535 90/57 mmHg 98.4 F (36.9 C) Oral 80  19  89 % 197 lb 8 oz (89.585 kg)  03/12/12 2021 - - - - - 97 % -  03/12/12 2013 105/66 mmHg 98.1 F (36.7 C) Oral 81  19  97 % -  03/12/12 1830 - - - - - 99 % -  03/12/12 1758 - - - - - 94 % -  03/12/12 1756 - - - - - 78 % -  03/12/12 1417 108/69 mmHg 98.4 F (36.9 C) - 80  18  94 % -  03/12/12 1154 97/55 mmHg - - 88  - - -  03/12/12 1038 86/52 mmHg 98.3 F (36.8 C) - 75  18  96 % -  03/12/12 0818 - - - - - 95 % -   Current Weight  03/13/12 197 lb 8 oz (89.585 kg)     Intake/Output from previous day: 06/01 0701 - 06/02 0700 In: 720 [P.O.:720] Out: 750 [Urine:750]    PHYSICAL EXAM:  Heart: RRR Lungs: few exp wheezes Wound: clean and dry, scant drainage from lower sternum Extremities: no significant edema  Lab Results: CBC:No results found for this basename: WBC:2,HGB:2,HCT:2,PLT:2 in the last 72 hours BMET:  Basename 03/13/12 0500 03/12/12 0517  NA 131* 133*  K 3.4* 3.7  CL 91* 90*  CO2 34* 36*  GLUCOSE 116* 117*  BUN 9 10  CREATININE 0.71 0.73  CALCIUM 8.4 8.6    PT/INR: No results found for this basename: LABPROT,INR in the last 72 hours   Assessment/Plan: S/P Procedure(s) (LRB): CORONARY ARTERY BYPASS GRAFTING (CABG) (N/A)  AF-SR.  Continue Amio.  BPs still low, so will not start beta blocker.  Sternal wound drainage resolving.  Change to po Keflex  Metabolic contraction alkalosis- CO2 improving. D/c Diamox at discharge.  Pt seen by Dr. Donata Clay this am- will hold discharge today to optimize her pulmonary status.   Family is concerned about taking her since they live in [place].  Will hold d/c until tomorrow. F/U CXR, labs in am.  LOS: 11 days    Rhianne Soman H 03/13/2012

## 2012-03-13 NOTE — Progress Notes (Signed)
Patient did not get her evening walk in.  Patient was nauseated and stated that she did not eat very much today. Patient was given IV Zofran per MD order. Patient stated that the medicine was effective in relieving her nausea. Will continue to monitor.

## 2012-03-13 NOTE — Progress Notes (Signed)
Pt ambulated in hallway 500 ft with rolling walker and assistance and tolerated activity well. Pt's sats on RA were 77% prior to walk. Pt ambulated on 2 L 02 and sats were 80%. O2 turned up to 3 L and sats increased to 95 % while walking. Pt returned to room and resting in bed, Sats were 94 % on 2 L while resting in bed. Will continue to monitor pt.

## 2012-03-13 NOTE — Progress Notes (Addendum)
[facility] delivered RW to pt's room.  Faxed HH RN orders to [place] with instruction for suture removal in on week. Isidoro Donning RN CCM Case Mgmt phone [phone]

## 2012-03-13 NOTE — Progress Notes (Signed)
[facility]  Subjective: She had an issue last night with the oxygen being turned off for 4hrs.  O2 sats were 40% when checked.  Objective: Vital signs in last 24 hours: Temp:  [98.1 F (36.7 C)-98.4 F (36.9 C)] 98.4 F (36.9 C) (06/02 0535) Pulse Rate:  [80-88] 80  (06/02 0535) Resp:  [18-19] 19  (06/02 0535) BP: (90-108)/(55-69) 90/57 mmHg (06/02 0535) SpO2:  [78 %-99 %] 98 % (06/02 0753) Weight:  [89.585 kg (197 lb 8 oz)] 89.585 kg (197 lb 8 oz) (06/02 0535) Last BM Date: 03/09/12  Intake/Output from previous day: 06/01 0701 - 06/02 0700 In: 720 [P.O.:720] Out: 750 [Urine:750] Intake/Output this shift: Total I/O In: 240 [P.O.:240] Out: -   Medications Current Facility-Administered Medications  Medication Dose Route Frequency Provider Last Rate Last Dose  . acetaZOLAMIDE (DIAMOX) 12 hr capsule 500 mg  500 mg Oral Daily Rowe Clack, PA   500 mg at 03/12/12 1054  . ALPRAZolam Prudy Feeler) tablet 1.5 mg  1.5 mg Oral BID Kerin Perna, MD   1.5 mg at 03/13/12 0845  . amiodarone (PACERONE) tablet 400 mg  400 mg Oral Q12H Kerin Perna, MD   400 mg at 03/12/12 2324  . aspirin EC tablet 325 mg  325 mg Oral Daily Ardelle Balls, [place]   325 mg at 03/12/12 1051   Or  . aspirin chewable tablet 324 mg  324 mg Per Tube Daily Donielle Margaretann Loveless, PA      . bisacodyl (DULCOLAX) EC tablet 10 mg  10 mg Oral Daily Ardelle Balls, PA   10 mg at 03/12/12 1059   Or  . bisacodyl (DULCOLAX) suppository 10 mg  10 mg Rectal Daily Ardelle Balls, PA      . budesonide (PULMICORT) nebulizer solution 0.5 mg  0.5 mg Nebulization BID Kerin Perna, MD   0.5 mg at 03/13/12 0753  . dextromethorphan-guaiFENesin (MUCINEX DM) 30-600 MG per 12 hr tablet 1 tablet  1 tablet Oral BID Kerin Perna, MD   1 tablet at 03/12/12 2324  . docusate sodium (COLACE) capsule 200 mg  200 mg Oral Daily Ardelle Balls, PA   200 mg at 03/12/12 1052  . furosemide  (LASIX) tablet 40 mg  40 mg Oral Q breakfast Kerin Perna, MD      . insulin aspart (novoLOG) injection 0-24 Units  0-24 Units Subcutaneous TID AC & HS Kerin Perna, MD      . iron polysaccharides (NIFEREX) capsule 150 mg  150 mg Oral Daily Wilmon Pali, PA   150 mg at 03/12/12 1054  . levalbuterol (XOPENEX) nebulizer solution 0.63 mg  0.63 mg Nebulization Q6H PRN Abelino Derrick, PA   0.63 mg at 03/07/12 0430  . levalbuterol (XOPENEX) nebulizer solution 0.63 mg  0.63 mg Nebulization TID BM Kerin Perna, MD   0.63 mg at 03/13/12 0753  . metoprolol (LOPRESSOR) injection 2.5-5 mg  2.5-5 mg Intravenous Q2H PRN Ardelle Balls, PA   5 mg at 03/09/12 2151  . nicotine (NICODERM CQ - dosed in mg/24 hours) patch 21 mg  21 mg Transdermal Daily Rometta Emery, MD   21 mg at 03/12/12 1056  . ondansetron (ZOFRAN) injection 4 mg  4 mg Intravenous Q6H PRN Ardelle Balls, PA   4 mg at 03/10/12 2341  . oxyCODONE (Oxy IR/ROXICODONE) immediate release tablet 5-10 mg  5-10 mg Oral Q3H PRN Donielle  Margaretann Loveless, PA   5 mg at 03/12/12 2327  . pantoprazole (PROTONIX) EC tablet 40 mg  40 mg Oral Q1200 Ardelle Balls, PA   40 mg at 03/12/12 1302  . potassium chloride SA (K-DUR,KLOR-CON) CR tablet 20 mEq  20 mEq Oral BID Kerin Perna, MD      . simvastatin (ZOCOR) tablet 20 mg  20 mg Oral q1800 Kerin Perna, MD   20 mg at 03/12/12 1810  . sodium chloride 0.9 % injection 10-40 mL  10-40 mL Intracatheter Q12H Kerin Perna, MD   10 mL at 03/10/12 2213  . sodium chloride 0.9 % injection 10-40 mL  10-40 mL Intracatheter PRN Kerin Perna, MD   10 mL at 03/12/12 0521  . sodium chloride 0.9 % injection 3 mL  3 mL Intravenous PRN Ardelle Balls, PA      . spironolactone (ALDACTONE) tablet 25 mg  25 mg Oral Daily Kerin Perna, MD      . traMADol Janean Sark) tablet 50 mg  50 mg Oral Q6H PRN Kerin Perna, MD   50 mg at 03/12/12 0311  . vancomycin (VANCOCIN) 750 mg in sodium chloride 0.9 %  150 mL IVPB  750 mg Intravenous Q12H Kerin Perna, MD   750 mg at 03/13/12 0851    PE: General appearance: alert, cooperative and no distress Lungs: Decreasd BS bilaterally Heart: regular rate and rhythm, S1, S2 normal, no murmur, click, rub or gallop Extremities: Trace LEE on the left. Pulses: 2+ and symmetric  Lab Results:  No results found for this basename: WBC:3,HGB:3,HCT:3,PLT:3 in the last 72 hours BMET  Basename 03/13/12 0500 03/12/12 0517 03/11/12 0428  NA 131* 133* 133*  K 3.4* 3.7 2.9*  CL 91* 90* 86*  CO2 34* 36* 41*  GLUCOSE 116* 117* 119*  BUN 9 10 11   CREATININE 0.71 0.73 0.69  CALCIUM 8.4 8.6 8.7     Assessment/Plan  Principal Problem:  *Exertional dyspnea Active Problems:  Anxiety disorder  CAD, ? Stent at [facility] in [facility], cath 03/02/12 with 95% Lt. 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 POD #10. S/P left thoracentesis(5/28) 500 cc with improved aeration and breathing. BP 86/52 - 108/69. Sinus rhythm on telemetry.  Hypokalemic-repleted.  Amio 400mg  BID/ASA/zocor.  Will have Home Health and O2.   No BB due to hypotension.      LOS: 11 days    HAGER, BRYAN 03/13/2012 11:29 AM   Patient seen and examined. Agree with assessment and plan. DC cancelled today with oxygen desaturation. Maintaining NSR. Metabolic alkalosis has improved.  Probable DC tomorrow.  Lennette Bihari, MD, [facility] 03/13/2012 11:44 AM

## 2012-03-14 LAB — BASIC METABOLIC PANEL
BUN: 8 mg/dL (ref 6–23)
CO2: 35 mEq/L — ABNORMAL HIGH (ref 19–32)
Calcium: 8.6 mg/dL (ref 8.4–10.5)
Chloride: 95 mEq/L — ABNORMAL LOW (ref 96–112)
Creatinine, Ser: 0.75 mg/dL (ref 0.50–1.10)
GFR calc Af Amer: >90 mL/min (ref >90)
GFR calc non Af Amer: >90 mL/min (ref >90)
Glucose, Bld: 104 mg/dL — ABNORMAL HIGH (ref 70–99)
Potassium: 3.4 mEq/L — ABNORMAL LOW (ref 3.5–5.1)
Sodium: 136 mEq/L (ref 135–145)

## 2012-03-14 LAB — GLUCOSE, CAPILLARY
Glucose-Capillary: 91 mg/dL (ref 70–99)
Glucose-Capillary: 111 mg/dL — ABNORMAL HIGH (ref 70–99)

## 2012-03-14 MED ORDER — AMIODARONE HCL 200 MG PO TABS
400.0000 mg | ORAL_TABLET | Freq: Two times a day (BID) | ORAL | Status: DC
  Administered 2012-03-14 – 2012-03-15 (×2): 400 mg via ORAL
  Filled 2012-03-14 (×3): qty 2

## 2012-03-14 MED ORDER — POTASSIUM CHLORIDE 10 MEQ/50ML IV SOLN
10.0000 meq | INTRAVENOUS | Status: AC
  Administered 2012-03-14 (×2): 10 meq via INTRAVENOUS
  Filled 2012-03-14 (×2): qty 50

## 2012-03-14 MED ORDER — POTASSIUM CHLORIDE CRYS ER 20 MEQ PO TBCR
40.0000 meq | EXTENDED_RELEASE_TABLET | Freq: Two times a day (BID) | ORAL | Status: DC
  Administered 2012-03-14 – 2012-03-15 (×3): 40 meq via ORAL
  Filled 2012-03-14 (×3): qty 2

## 2012-03-14 NOTE — Progress Notes (Signed)
Patient ambulated full length of long hall and the circle on 2L of O2.  Tolerated well. Baird Lyons 4:25 PM

## 2012-03-14 NOTE — Progress Notes (Signed)
CARDIAC REHAB PHASE I   PRE:  Rate/Rhythm: 80SR  BP:  Supine:   Sitting: 110/70  Standing:    SaO2: 95%2L  MODE:  Ambulation: 750 ft   POST:  Rate/Rhythem: 93  BP:  Supine: 92/50  Sitting:   Standing:    SaO2: 95%2L 1000-1047 Pt very sleepy and hard to wake up for walk. Once sitting, pt became more awake. Walked 750 ft on 2L with rolling walker and asst x 1.  Tolerated well. To bathroom and then bed after walk. Left on 2L and continuous sat machine.  Duanne Limerick

## 2012-03-14 NOTE — Progress Notes (Signed)
Ordered a continuous pulse ox per MD order and applied to patient.  Patient's O2 sats are 95 on 2L [place] while sleeping. Will continue to monitor.

## 2012-03-14 NOTE — Progress Notes (Addendum)
[facility]  Subjective: Feels better.  Very tired and sleepy but arousable.  Objective: Vital signs in last 24 hours: Temp:  [97.8 F (36.6 C)-98.4 F (36.9 C)] 98 F (36.7 C) (06/03 0448) Pulse Rate:  [73-77] 73  (06/03 0448) Resp:  [17-18] 18  (06/03 0448) BP: (98-123)/(57-78) 112/71 mmHg (06/03 0448) SpO2:  [96 %-100 %] 100 % (06/03 0803) Weight:  [89.041 kg (196 lb 4.8 oz)] 89.041 kg (196 lb 4.8 oz) (06/03 0448) Last BM Date: 03/09/12  Intake/Output from previous day: 06/02 0701 - 06/03 0700 In: 750 [P.O.:600; IV Piggyback:150] Out: 2400 [Urine:2400] Intake/Output this shift: Total I/O In: -  Out: 1000 [Urine:1000]  Medications Current Facility-Administered Medications  Medication Dose Route Frequency Provider Last Rate Last Dose  . acetaZOLAMIDE (DIAMOX) 12 hr capsule 500 mg  500 mg Oral Daily Rowe Clack, PA   500 mg at 03/13/12 1635  . ALPRAZolam Prudy Feeler) tablet 1.5 mg  1.5 mg Oral BID Kerin Perna, MD   1.5 mg at 03/13/12 2131  . amiodarone (PACERONE) tablet 400 mg  400 mg Oral Q12H Kerin Perna, MD   400 mg at 03/13/12 2127  . aspirin EC tablet 325 mg  325 mg Oral Daily Ardelle Balls, PA   325 mg at 03/14/12 1000   Or  . aspirin chewable tablet 324 mg  324 mg Per Tube Daily Donielle Margaretann Loveless, PA      . bisacodyl (DULCOLAX) EC tablet 10 mg  10 mg Oral Daily Ardelle Balls, PA   10 mg at 03/14/12 1008   Or  . bisacodyl (DULCOLAX) suppository 10 mg  10 mg Rectal Daily Ardelle Balls, PA      . budesonide (PULMICORT) nebulizer solution 0.5 mg  0.5 mg Nebulization BID Kerin Perna, MD   0.5 mg at 03/14/12 0801  . dextromethorphan-guaiFENesin (MUCINEX DM) 30-600 MG per 12 hr tablet 1 tablet  1 tablet Oral BID Kerin Perna, MD   1 tablet at 03/14/12 0959  . docusate sodium (COLACE) capsule 200 mg  200 mg Oral Daily Ardelle Balls, PA   200 mg at 03/14/12 0959  . furosemide (LASIX) tablet 40 mg  40 mg  Oral Q breakfast Kerin Perna, MD   40 mg at 03/14/12 [phone]  . insulin aspart (novoLOG) injection 0-24 Units  0-24 Units Subcutaneous TID AC & HS Kerin Perna, MD      . iron polysaccharides (NIFEREX) capsule 150 mg  150 mg Oral Daily Wilmon Pali, PA   150 mg at 03/14/12 0959  . levalbuterol (XOPENEX) nebulizer solution 0.63 mg  0.63 mg Nebulization Q6H PRN Abelino Derrick, PA   0.63 mg at 03/07/12 0430  . levalbuterol (XOPENEX) nebulizer solution 0.63 mg  0.63 mg Nebulization TID BM Kerin Perna, MD   0.63 mg at 03/14/12 0801  . metoprolol (LOPRESSOR) injection 2.5-5 mg  2.5-5 mg Intravenous Q2H PRN Ardelle Balls, PA   5 mg at 03/09/12 2151  . nicotine (NICODERM CQ - dosed in mg/24 hours) patch 21 mg  21 mg Transdermal Daily Rometta Emery, MD   21 mg at 03/14/12 1000  . ondansetron (ZOFRAN) injection 4 mg  4 mg Intravenous Q6H PRN Ardelle Balls, PA   4 mg at 03/13/12 2128  . oxyCODONE (Oxy IR/ROXICODONE) immediate release tablet 5-10 mg  5-10 mg Oral Q3H PRN Ardelle Balls, PA   5 mg  at 03/12/12 2327  . pantoprazole (PROTONIX) EC tablet 40 mg  40 mg Oral Q1200 Ardelle Balls, PA   40 mg at 03/13/12 1144  . potassium chloride 10 mEq in 50 mL *CENTRAL LINE* IVPB  10 mEq Intravenous Q1 Hr x 2 Kerin Perna, MD   10 mEq at 03/14/12 1043  . potassium chloride SA (K-DUR,KLOR-CON) CR tablet 40 mEq  40 mEq Oral BID Rowe Clack, PA   40 mEq at 03/14/12 1009  . simvastatin (ZOCOR) tablet 20 mg  20 mg Oral q1800 Kerin Perna, MD   20 mg at 03/13/12 1635  . sodium chloride 0.9 % injection 10-40 mL  10-40 mL Intracatheter Q12H Kerin Perna, MD   10 mL at 03/10/12 2213  . sodium chloride 0.9 % injection 10-40 mL  10-40 mL Intracatheter PRN Kerin Perna, MD   10 mL at 03/13/12 1627  . sodium chloride 0.9 % injection 3 mL  3 mL Intravenous PRN Ardelle Balls, PA      . spironolactone (ALDACTONE) tablet 25 mg  25 mg Oral Daily Kerin Perna, MD   25 mg at  03/14/12 0959  . traMADol (ULTRAM) tablet 50 mg  50 mg Oral Q6H PRN Kerin Perna, MD   50 mg at 03/12/12 0311  . vancomycin (VANCOCIN) 750 mg in sodium chloride 0.9 % 150 mL IVPB  750 mg Intravenous Q12H Kerin Perna, MD   750 mg at 03/13/12 2115  . DISCONTD: potassium chloride SA (K-DUR,KLOR-CON) CR tablet 20 mEq  20 mEq Oral BID Kerin Perna, MD   20 mEq at 03/13/12 2129    PE: General appearance: alert, cooperative, no distress and Sleepy Lungs: clear to auscultation bilaterally and Decreased BS bilaterally.   Heart: regular rate and rhythm, S1, S2 normal, no murmur, click, rub or gallop Extremities: Trace LEE Pulses: 2+ and symmetric  Lab Results:  No results found for this basename: WBC:3,HGB:3,HCT:3,PLT:3 in the last 72 hours BMET  Basename 03/14/12 0400 03/13/12 0500 03/12/12 0517  NA 136 131* 133*  K 3.4* 3.4* 3.7  CL 95* 91* 90*  CO2 35* 34* 36*  GLUCOSE 104* 116* 117*  BUN 8 9 10   CREATININE 0.75 0.71 0.73  CALCIUM 8.6 8.4 8.6   Telemetry: SR now (did not see further Afib this PM)  Assessment/Plan  Principal Problem:  *Exertional dyspnea Active Problems:  Anxiety disorder  CAD, ? Stent at [facility] in 2001, cath 03/02/12 with 95% Lt. 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 POD #11. S/P left thoracentesis(5/28) 500 cc with improved aeration and breathing. BP 98/57 - 123/78. Sinus rhythm on telemetry. Hypokalemic-repleted. Amio 400mg  BID/ASA/zocor. Will have Home Health and O2.  Ambulated well with 2L O2.  Episodes of afib.  Maintaining NSR  In the 70's now on Amiodarone.    LOS: 12 days   HAGER, BRYAN 03/14/2012 10:55 AM  I  have seen & examined the patient and reviewed the chart this afternoon.  I agree with the findings, exam & plan as noted by Mr. Leron Croak.  51 y/o s/p Urgent CABG x 2 for LM  Dz found in setting of ACS/NSTEMI.  POD #11. Has been troubled by Post-op Afib & her baseline COPD & desaturations. As Afib seems to be recurrent despite of Amiodarone, would consider initiation of anticoagulation -- as we are not sure of her follow-up, warfarin may not be the best option; Xarelto /Pradaxa or Eliquis may be viable options if cost is not prohibitive.  May not need long term in Afib dissipates.  Probably not necessary to bridge.   BP is stable now. - only on diuretics - Aldactone, Lasix & diamox & continues to diurese well. Renal function is stable & metabolic alkalosis less pronounce --? If elevated CO2 is contraction alkalosis vs. Related to COPD.  With Amiodarone, is getting some BB effect for cardiac protection.  Would consider ACE-I, but as OP.  Discussed with Dr. Donata Clay, who is considering discharge in the AM.  Marykay Lex, M.D., M.S. [facility] [address] [place], [place]  [address]  [phone] Pager # [phone]  03/14/2012 3:50 PM '

## 2012-03-14 NOTE — Progress Notes (Addendum)
[address]            [place] [address]          [phone]     11 Days Post-Op  Procedure(s) (LRB): CORONARY ARTERY BYPASS GRAFTING (CABG) (N/A) Subjective: Had some intermittent episodes of afib  Objective  Telemetry SR/Afib  Temp:  [97.8 F (36.6 C)-98.4 F (36.9 C)] 98 F (36.7 C) (06/03 0448) Pulse Rate:  [73-77] 73  (06/03 0448) Resp:  [17-18] 18  (06/03 0448) BP: (98-123)/(57-78) 112/71 mmHg (06/03 0448) SpO2:  [96 %-98 %] 97 % (06/03 0448) Weight:  [196 lb 4.8 oz (89.041 kg)] 196 lb 4.8 oz (89.041 kg) (06/03 0448)   Intake/Output Summary (Last 24 hours) at 03/14/12 0727 Last data filed at 03/14/12 0659  Gross per 24 hour  Intake    750 ml  Output   2400 ml  Net  -1650 ml       General appearance: alert, cooperative and no distress Heart: regular rate and rhythm and S1, S2 normal Lungs: diminished in bases Abdomen: soft, nontender Extremities: trace edema Wound: incisions healing well , scant drainage from lower pole of sternal incision  Lab Results:  Basename 03/14/12 0400 03/13/12 0500 03/11/12 1815  NA 136 131* --  K 3.4* 3.4* --  CL 95* 91* --  CO2 35* 34* --  GLUCOSE 104* 116* --  BUN 8 9 --  CREATININE 0.75 0.71 --  CALCIUM 8.6 8.4 --  MG -- -- 2.0  PHOS -- -- --   No results found for this basename: AST:2,ALT:2,ALKPHOS:2,BILITOT:2,PROT:2,ALBUMIN:2 in the last 72 hours No results found for this basename: LIPASE:2,AMYLASE:2 in the last 72 hours No results found for this basename: WBC:2,NEUTROABS:2,HGB:2,HCT:2,MCV:2,PLT:2 in the last 72 hours No results found for this basename: CKTOTAL:4,CKMB:4,TROPONINI:4 in the last 72 hours No components found with this basename: POCBNP:3 No results found for this basename: DDIMER in the last 72 hours No results found for this basename: HGBA1C in the last 72 hours No results found for this basename: CHOL,HDL,LDLCALC,TRIG,CHOLHDL in the last 72 hours No results found  for this basename: TSH,T4TOTAL,FREET3,T3FREE,THYROIDAB in the last 72 hours No results found for this basename: VITAMINB12,FOLATE,FERRITIN,TIBC,IRON,RETICCTPCT in the last 72 hours  Medications: Scheduled    . acetaZOLAMIDE  500 mg Oral Daily  . ALPRAZolam  1.5 mg Oral BID  . amiodarone  400 mg Oral Q12H  . aspirin EC  325 mg Oral Daily   Or  . aspirin  324 mg Per Tube Daily  . bisacodyl  10 mg Oral Daily   Or  . bisacodyl  10 mg Rectal Daily  . budesonide  0.5 mg Nebulization BID  . dextromethorphan-guaiFENesin  1 tablet Oral BID  . docusate sodium  200 mg Oral Daily  . furosemide  40 mg Oral Q breakfast  . insulin aspart  0-24 Units Subcutaneous TID AC & HS  . iron polysaccharides  150 mg Oral Daily  . levalbuterol  0.63 mg Nebulization TID BM  . nicotine  21 mg Transdermal Daily  . pantoprazole  40 mg Oral Q1200  . potassium chloride  20 mEq Oral BID  . simvastatin  20 mg Oral q1800  . sodium chloride  10-40 mL Intracatheter Q12H  . spironolactone  25 mg Oral Daily  . vancomycin  750 mg Intravenous Q12H     Radiology/Studies:  Dg Chest 2 View  03/13/2012  *RADIOLOGY REPORT*  Clinical Data: Low O2 sats, cough.  CHEST - 2 VIEW  Comparison: 03/11/2012  Findings: Prior CABG.  There is cardiomegaly.  Worsening interstitial opacities, likely interstitial edema.  Right PICC line is in place with the tip at the cavoatrial junction.  Bibasilar opacities, left greater than right and persistent small effusions, also greater on the left.  IMPRESSION: Increasing interstitial prominence, likely interstitial edema.  Continued bibasilar opacities and small effusions, left greater than right.  Original Report Authenticated By: Cyndie Chime, M.D.    INR: Will add last result for INR, ABG once components are confirmed Will add last 4 CBG results once components are confirmed  Assessment/Plan: S/P Procedure(s) (LRB): CORONARY ARTERY BYPASS GRAFTING (CABG) (N/A)  1. Doing well overall,  will have to determine if needs anticoagulation for PAF, cont amiodarone, cont to replace K+ 2. Cont rehab/pulm toilet 3 cont current abx  LOS: 12 days    Caylin Raby E 6/3/20137:27 AM    patient examined and medical record reviewed,agree with above note  Still with metabolic alkalosis,lowK+- will give 2 runs IV and check in am Patient with compliance issues so not a candidate for short term coumadin  CXR with min effusions, no thoracentesis needed. [name],PETER 03/14/2012

## 2012-03-14 NOTE — Progress Notes (Signed)
Notified by telemetry clerk that patient had a quick run of AFib and went right back into NSR at 2049 and 0301 .  Will continue to monitor.

## 2012-03-15 ENCOUNTER — Inpatient Hospital Stay (HOSPITAL_COMMUNITY): Payer: Medicaid Other

## 2012-03-15 LAB — BASIC METABOLIC PANEL
BUN: 8 mg/dL (ref 6–23)
Chloride: 97 mEq/L (ref 96–112)
Creatinine, Ser: 0.65 mg/dL (ref 0.50–1.10)
GFR calc Af Amer: >90 mL/min (ref >90)
GFR calc non Af Amer: >90 mL/min (ref >90)
Potassium: 3.9 mEq/L (ref 3.5–5.1)

## 2012-03-15 LAB — CBC
HCT: 25.1 % — ABNORMAL LOW (ref 36.0–46.0)
Hemoglobin: 7.4 g/dL — ABNORMAL LOW (ref 12.0–15.0)
MCH: 29.2 pg (ref 26.0–34.0)
MCHC: 29.5 g/dL — ABNORMAL LOW (ref 30.0–36.0)
MCV: 99.2 fL (ref 78.0–100.0)
Platelets: 211 10*3/uL (ref 150–400)
RBC: 2.53 MIL/uL — ABNORMAL LOW (ref 3.87–5.11)
RDW: 17.4 % — ABNORMAL HIGH (ref 11.5–15.5)
WBC: 17.5 10*3/uL — ABNORMAL HIGH (ref 4.0–10.5)

## 2012-03-15 LAB — GLUCOSE, CAPILLARY: Glucose-Capillary: 98 mg/dL (ref 70–99)

## 2012-03-15 MED ORDER — CLOPIDOGREL BISULFATE 75 MG PO TABS
75.0000 mg | ORAL_TABLET | Freq: Every day | ORAL | Status: DC
  Administered 2012-03-15: 75 mg via ORAL
  Filled 2012-03-15 (×2): qty 1

## 2012-03-15 MED ORDER — MOXIFLOXACIN HCL 400 MG PO TABS: 400.0000 mg | ORAL_TABLET | Freq: Every day | ORAL | Status: DC

## 2012-03-15 MED ORDER — SPIRONOLACTONE 25 MG PO TABS: 25.0000 mg | ORAL_TABLET | Freq: Every day | ORAL | Status: DC

## 2012-03-15 MED ORDER — POTASSIUM CHLORIDE CRYS ER 20 MEQ PO TBCR: 20.0000 meq | EXTENDED_RELEASE_TABLET | Freq: Every day | ORAL | Status: DC

## 2012-03-15 MED ORDER — FUROSEMIDE 40 MG PO TABS: 40.0000 mg | ORAL_TABLET | Freq: Every day | ORAL | Status: AC

## 2012-03-15 MED ORDER — CLOPIDOGREL BISULFATE 75 MG PO TABS: 75.0000 mg | ORAL_TABLET | Freq: Every day | ORAL | Status: AC

## 2012-03-15 MED ORDER — ASPIRIN 81 MG PO TBEC: 81.0000 mg | DELAYED_RELEASE_TABLET | Freq: Every day | ORAL | Status: AC

## 2012-03-15 MED ORDER — ASPIRIN 325 MG PO TBEC: 325.0000 mg | DELAYED_RELEASE_TABLET | Freq: Every day | ORAL | Status: DC

## 2012-03-15 MED ORDER — MOXIFLOXACIN HCL 400 MG PO TABS
400.0000 mg | ORAL_TABLET | Freq: Every day | ORAL | Status: DC
  Administered 2012-03-15: 400 mg via ORAL
  Filled 2012-03-15: qty 1

## 2012-03-15 MED ORDER — ASPIRIN EC 81 MG PO TBEC
81.0000 mg | DELAYED_RELEASE_TABLET | Freq: Every day | ORAL | Status: DC
  Administered 2012-03-15: 81 mg via ORAL
  Filled 2012-03-15: qty 1

## 2012-03-15 NOTE — Progress Notes (Signed)
CARDIAC REHAB PHASE I   PRE:  Rate/Rhythm: 76SR  BP:  Supine:   Sitting: 102/70  Standing:    SaO2: 99%2L  MODE:  Ambulation: 890 ft   POST:  Rate/Rhythem: 90  BP:  Supine:    Sitting: 100/60  Standing:    SaO2: 94%2L 1045-1140 Took pt to bathroom and helped her get cleaned up before and after walk. Pt walked 890 ft with rolling walker and 2L with asst x 1. Took several standing rest breaks during walk.  Tolerated well. To recliner with call bell. Left on 2L. Pt disappointed that she did not go home today.  Duanne Limerick

## 2012-03-15 NOTE — Progress Notes (Addendum)
[address]            [place] [address]          [phone]     12 Days Post-Op  Procedure(s) (LRB): CORONARY ARTERY BYPASS GRAFTING (CABG) (N/A) Subjective: Increased cough and congestion this am, + Brownish sputum  Objective  Telemetry NSR/PAF  Temp:  [97.5 F (36.4 C)-98.4 F (36.9 C)] 97.5 F (36.4 C) (06/04 0500) Pulse Rate:  [77-80] 77  (06/04 0500) Resp:  [16-18] 18  (06/04 0500) BP: (101-106)/(60-61) 101/61 mmHg (06/04 0500) SpO2:  [97 %-100 %] 97 % (06/04 0500) FiO2 (%):  [28 %] 28 % (06/03 2050) Weight:  [197 lb (89.359 kg)] 197 lb (89.359 kg) (06/04 0500)   Intake/Output Summary (Last 24 hours) at 03/15/12 0748 Last data filed at 03/15/12 0500  Gross per 24 hour  Intake    780 ml  Output   3452 ml  Net  -2672 ml       General appearance: alert, cooperative and no distress Heart: regular rate and rhythm and S1, S2 normal Lungs: + wheeze upper airways, improves some with cough Abdomen: soft, non tender Extremities: min edema Wound: healing well, scant drainage  Lab Results:  Basename 03/15/12 0530 03/14/12 0400  NA 137 136  K 3.9 3.4*  CL 97 95*  CO2 34* 35*  GLUCOSE 95 104*  BUN 8 8  CREATININE 0.65 0.75  CALCIUM 8.7 8.6  MG -- --  PHOS -- --   No results found for this basename: AST:2,ALT:2,ALKPHOS:2,BILITOT:2,PROT:2,ALBUMIN:2 in the last 72 hours No results found for this basename: LIPASE:2,AMYLASE:2 in the last 72 hours  Basename 03/15/12 0530  WBC 17.5*  NEUTROABS --  HGB 7.4*  HCT 25.1*  MCV 99.2  PLT 211   No results found for this basename: CKTOTAL:4,CKMB:4,TROPONINI:4 in the last 72 hours No components found with this basename: POCBNP:3 No results found for this basename: DDIMER in the last 72 hours No results found for this basename: HGBA1C in the last 72 hours No results found for this basename: CHOL,HDL,LDLCALC,TRIG,CHOLHDL in the last 72 hours No results found for this basename:  TSH,T4TOTAL,FREET3,T3FREE,THYROIDAB in the last 72 hours No results found for this basename: VITAMINB12,FOLATE,FERRITIN,TIBC,IRON,RETICCTPCT in the last 72 hours  Medications: Scheduled    . acetaZOLAMIDE  500 mg Oral Daily  . ALPRAZolam  1.5 mg Oral BID  . amiodarone  400 mg Oral BID  . aspirin EC  325 mg Oral Daily   Or  . aspirin  324 mg Per Tube Daily  . bisacodyl  10 mg Oral Daily   Or  . bisacodyl  10 mg Rectal Daily  . budesonide  0.5 mg Nebulization BID  . dextromethorphan-guaiFENesin  1 tablet Oral BID  . docusate sodium  200 mg Oral Daily  . furosemide  40 mg Oral Q breakfast  . insulin aspart  0-24 Units Subcutaneous TID AC & HS  . iron polysaccharides  150 mg Oral Daily  . levalbuterol  0.63 mg Nebulization TID BM  . nicotine  21 mg Transdermal Daily  . pantoprazole  40 mg Oral Q1200  . potassium chloride  10 mEq Intravenous Q1 Hr x 2  . potassium chloride  40 mEq Oral BID  . simvastatin  20 mg Oral q1800  . sodium chloride  10-40 mL Intracatheter Q12H  . spironolactone  25 mg Oral Daily  .  vancomycin  750 mg Intravenous Q12H     Radiology/Studies:  Dg Chest 2 View  03/15/2012  *RADIOLOGY REPORT*  Clinical Data: Bypass surgery.  Shortness of breath.  CHEST - 2 VIEW  Comparison: 03/13/2012.  Findings: The heart is borderline enlarged but stable.  There is a persistent left pleural effusion with overlying atelectasis or infiltrate.  The right lung remains clear.  IMPRESSION: Persistent left effusion and overlying atelectasis or infiltrate.  Original Report Authenticated By: P. Loralie Champagne, M.D.   Dg Chest 2 View  03/13/2012  *RADIOLOGY REPORT*  Clinical Data: Low O2 sats, cough.  CHEST - 2 VIEW  Comparison: 03/11/2012  Findings: Prior CABG.  There is cardiomegaly.  Worsening interstitial opacities, likely interstitial edema.  Right PICC line is in place with the tip at the cavoatrial junction.  Bibasilar opacities, left greater than right and persistent small  effusions, also greater on the left.  IMPRESSION: Increasing interstitial prominence, likely interstitial edema.  Continued bibasilar opacities and small effusions, left greater than right.  Original Report Authenticated By: Cyndie Chime, M.D.    INR: Will add last result for INR, ABG once components are confirmed Will add last 4 CBG results once components are confirmed  Assessment/Plan: S/P Procedure(s) (LRB): CORONARY ARTERY BYPASS GRAFTING (CABG) (N/A)  1 Bronchitis, WBC 17.5, add avelox, d/c vanco at this time, push pulm toilet, on prn nebs 2 PAF, poss addition of AC rx/antiplatelet rx 3 can prob stop diamox soon   LOS: 13 days    GOLD,WAYNE E 6/4/20137:48 AM    patient examined and medical record reviewed,agree with above note. Pulmonary status sl worse on exam,CXR and rising WBC. Check cultures and repeat CBC in am [name],Alva Broxson 03/15/2012   Patient examined out of bathroom, lungs clear.Patient walked 969ft.Incision dry.Logistic problem with dishcge tomorrow so will send home today and f/u with patient on phone [phone].

## 2012-03-15 NOTE — Progress Notes (Signed)
Patient oxygen saturations on 2L of O2 are 100%. Patient's oxygen saturations are 85% on room air while at rest.

## 2012-03-15 NOTE — Progress Notes (Addendum)
03/15/2012 1140 Received call from Dorene Sorrow # [phone], RN for Victoria Bell. Pt was scheduled visit. NCM made aware pt was still IP. Victoria Bell rep requested d/c summary sent to [facility] office and they will schedule visit next day post d/c. Intake fax #[phone]. Isidoro Donning RN CCM Case Mgmt phone [phone]

## 2012-03-16 ENCOUNTER — Telehealth: Payer: Self-pay

## 2012-03-16 DIAGNOSIS — J40 Bronchitis, not specified as acute or chronic: Secondary | ICD-10-CM

## 2012-03-16 MED ORDER — LEVOFLOXACIN 500 MG PO TABS: 500.0000 mg | ORAL_TABLET | Freq: Every day | ORAL | Status: AC

## 2012-03-16 NOTE — Telephone Encounter (Signed)
Pt was given 3 tablets of Avelox at discharge. She was instructed to complete tablets given and a new RX for Levaquin 500 po every day x 7 days was call to pharm [phone]. Per Gershon Crane-  PA.

## 2012-03-22 NOTE — Discharge Summary (Signed)
[address]            [place] [address]          [phone]            Discharge Summary  Name: Victoria Bell  DOB: [birth year] 51 y.o.  MRN: [id number]   Admission Date: 03/02/2012  Discharge Date:     Admitting Diagnosis:  Chest pain  Shortness of breath    Discharge Diagnosis:  Non-ST segment elevation myocardial infarction  Left main and severe coronary artery disease  Hypertension  Anxiety  Gastroesophageal reflux disease  Type 2 diabetes mellitus  History of myocardial infarction in 2001  History of tobacco abuse  Postoperative atrial fibrillation  Postoperative contraction alkalosis  Left pleural effusion  Sternal wound drainage    Procedures:  Procedure(s):  CORONARY ARTERY BYPASS GRAFTING x 2 (left internal mammary artery to the LAD, saphenous vein graft to the circumflex ), EVH bilateral thighs - 03/03/2012 Ultrasound-guided left thoracentesis -03/08/2012   HPI: The patient is a 51 y.o. female with a history of coronary artery disease, status post MI about 12 years ago, treated at [facility]. The last 2 months, she has noticed exertional dyspnea associated with angina. She has not been followed by her cardiologist or any other medical physician due to lack of insurance. On the day of admission, she went to visit her mother in [facility] and a nurse noticed that she was wheezing. She was given a breathing treatment, aspirin and evaluated in the hospital. The patient was found to have elevated troponins and was transferred to [facility] for cardiology work up, as there were no beds available at the hospitals surrounding [place].    Hospital Course: The patient was admitted to [facility] on 03/02/2012.  She was seen by cardiology and ruled in for a non-ST segment elevation myocardial infarction. She underwent cardiac catheterization by Dr. Allyson Sabal on 03/02/2012, which showed high grade distal left  main disease and 95% with preserved left ventricular function. A cardiac surgery consultation was requested and the patient was seen by Dr. Kathlee Nations Trigt. After review of her films, Dr. Donata Clay felt to benefit from urgent surgical revascularization.  All risks, benefits and alternatives of surgery were explained in detail, and the patient agreed to proceed. The patient was taken to the operating room and underwent the above procedure.  The postoperative course was notable for a brief episode of nonsustained atrial fibrillation. She was started on oral amiodarone and has remained in sinus rhythm. She developed drainage from the lower portion of her sternum which was serosanguineous in nature. She started empirically on IV antibiotics. She was somewhat volume overloaded postoperatively and was started on Lasix. Despite aggressive diuresis, she had a persistent left pleural effusion with shortness of breath and decreased O2 sats. She ultimately underwent an ultrasound-guided thoracentesis in radiology on 03/08/2012 which yielded 500 cc of serosanguineous fluid. She developed a metabolic contraction alkalosis which is thought to be secondary to diuresis. Her Lasix was discontinued and she was treated with Diamox. She was initially started on a beta blocker postoperatively, however she developed hypotension with systolic blood pressures in the 70s to 90s and this was discontinued. Her blood pressures are slowly improving but still remain around 100 systolic, therefore she has not been restarted on a beta blocker or an ACE inhibitor.  Overall, she  is progressing well. She is ambulating with cardiac rehab and tolerating a regular diet. She has remained afebrile and vital signs have been stable. Her sternal drainage is improving and there is no surrounding erythema or sternal instability. She has been counseled regarding smoking cessation. She is being weaned from oxygen. We anticipate discharge home in the next 48  hours, provided she continues to progress and no acute changes occur.    Recent vital signs:  Filed Vitals:    03/11/12 0302   BP:  103/63   Pulse:  82   Temp:    Resp:  18      Recent laboratory studies:  CBC:  Basename  03/09/12 0215   WBC  13.4*   HGB  8.8*   HCT  27.3*   PLT  195    BMET:  Basename  03/11/12 0428  03/10/12 0559   NA  133*  134*   K  2.9*  4.1   CL  86*  84*   CO2  41*  >45*   GLUCOSE  119*  99   BUN  11  16   CREATININE  0.69  0.71   CALCIUM  8.7  8.7    PT/INR: No results found for this basename: LABPROT,INR in the last 72 hours     Discharge Medications:  Medication List  As of 03/11/2012 9:51 AM    TAKE these medications          ALPRAZolam 1 MG tablet      Commonly known as: XANAX      Take 1.5 mg by mouth 3 (three) times daily.      amiodarone 400 MG tablet      Commonly known as: PACERONE      400 mg po bid x 1 week, then 200 mg po bid      aspirin 325 MG EC tablet      Take 1 tablet (325 mg total) by mouth daily.      cephALEXin 500 MG capsule      Commonly known as: KEFLEX      Take 1 capsule (500 mg total) by mouth 3 (three) times daily. X 1 week      iron polysaccharides 150 MG capsule      Commonly known as: NIFEREX      Take 1 capsule (150 mg total) by mouth daily.      nicotine 21 mg/24hr patch      Commonly known as: NICODERM CQ - dosed in mg/24 hours      Place 1 patch onto the skin daily.      oxyCODONE 5 MG immediate release tablet      Commonly known as: Oxy IR/ROXICODONE      Take 1-2 tablets (5-10 mg total) by mouth every 3 (three) hours as needed for pain.      simvastatin 20 MG tablet      Commonly known as: ZOCOR      Take 1 tablet (20 mg total) by mouth at bedtime.         Discharge Instructions: The patient is to refrain from driving, heavy lifting or strenuous activity. May shower daily and clean incisions with soap and water. May resume regular diet.     Discharge Orders    Future Appointments:   Provider:  Department:  Dept Phone:  Center:    04/06/2012 11:30 AM  Kerin Perna, MD  [facility]  [phone]  [facility]  Follow-up Information    Follow up with VAN Dinah Beers, MD on 04/06/2012. (Have a chest x-ray at 10:30, then see MD at 11:30)    Contact information:    [address]  [address]  [place] [address]  [phone]       Follow up with Runell Gess, MD. Schedule an appointment as soon as possible for a visit in 2 weeks.    Contact information:    [address]  [address]  [place] [address]  [phone]         Adella Hare  03/11/2012, 9:51 AM

## 2012-03-24 NOTE — Discharge Summary (Signed)
patient examined and medical record reviewed,agree with above note. [name],Annalyssa Thune 03/24/2012

## 2012-03-28 ENCOUNTER — Other Ambulatory Visit: Payer: Self-pay | Admitting: Thoracic Surgery (Cardiothoracic Vascular Surgery)

## 2012-03-28 DIAGNOSIS — I2510 Atherosclerotic heart disease of native coronary artery without angina pectoris: Secondary | ICD-10-CM

## 2012-04-04 ENCOUNTER — Ambulatory Visit
Admission: RE | Admit: 2012-04-04 | Discharge: 2012-04-04 | Disposition: A | Discharge status: Home / Self Care | Payer: Self-pay | Source: Ambulatory Visit | Attending: Thoracic Surgery (Cardiothoracic Vascular Surgery) | Admitting: Thoracic Surgery (Cardiothoracic Vascular Surgery)

## 2012-04-04 ENCOUNTER — Ambulatory Visit (INDEPENDENT_AMBULATORY_CARE_PROVIDER_SITE_OTHER): Payer: Self-pay | Admitting: Surgical

## 2012-04-04 VITALS — BP 130/86 | HR 80 | Resp 18 | Ht 60.0 in | Wt 177.0 lb

## 2012-04-04 DIAGNOSIS — I2510 Atherosclerotic heart disease of native coronary artery without angina pectoris: Secondary | ICD-10-CM

## 2012-04-04 DIAGNOSIS — Z951 Presence of aortocoronary bypass graft: Secondary | ICD-10-CM

## 2012-04-04 MED ORDER — OXYCODONE HCL 5 MG PO TABS: 5.0000 mg | ORAL_TABLET | Freq: Four times a day (QID) | ORAL | Status: AC | PRN

## 2012-04-04 NOTE — Patient Instructions (Signed)
Instructions given as to ongoing restrictions. Prescription given for oxycodone 5 mg by mouth every 6 hours when necessary #30

## 2012-04-04 NOTE — Progress Notes (Signed)
PCP is Chauncy Passy, MD Referring Provider is Runell Gess, MD  Chief Complaint  Patient presents with  . Routine Post Op    3 week f/u from surgery with CXR, S/P CABG x 2 on 03/03/12    HPI: Patient is seen in the office in routine followup following coronary artery bypass grafting. Overall she is making good progress. She does have occasional nausea without vomiting. She also has mild constipation. She has a fair appetite but is slowly improving. Ambulation continues to improve. She continues to use oxygen at night. She denies fevers, chills or other constitutional symptoms.   Past Medical History  Diagnosis Date  . Hypertension   . Anxiety   . GERD (gastroesophageal reflux disease)   . Myocardial infarction     2001  . Diabetes mellitus     type 2    Past Surgical History  Procedure Date  . Cardiac catheterization     2001  . Cholecystectomy   . Tubal ligation   . Coronary artery bypass graft 03/03/2012    Procedure: CORONARY ARTERY BYPASS GRAFTING (CABG);  Surgeon: Kerin Perna, MD;  Location: [facility] OR;  Service: Open Heart Surgery;  Laterality: N/A;    Family History  Problem Relation Age of Onset  . Coronary artery disease Father 15    MI/CABG    Social History History  Substance Use Topics  . Smoking status: Former Smoker -- 2.0 packs/day for 30 years    Types: Cigarettes    Quit date: 03/03/2012  . Smokeless tobacco: Not on file  . Alcohol Use: No    Current Outpatient Prescriptions  Medication Sig Dispense Refill  . amiodarone (PACERONE) 400 MG tablet 400 mg po bid x 1 week, then 200 mg po bid  75 tablet  1  . aspirin EC 81 MG EC tablet Take 1 tablet (81 mg total) by mouth daily.  30 tablet  0  . clopidogrel (PLAVIX) 75 MG tablet Take 1 tablet (75 mg total) by mouth daily with breakfast.  30 tablet  1  . furosemide (LASIX) 40 MG tablet Take 1 tablet (40 mg total) by mouth daily with breakfast.  7 tablet  0  . iron polysaccharides (NIFEREX) 150  MG capsule Take 1 capsule (150 mg total) by mouth daily.  30 capsule  0  . oxycodone (OXY-IR) 5 MG capsule Take 5 mg by mouth every 4 (four) hours as needed.      . simvastatin (ZOCOR) 20 MG tablet Take 1 tablet (20 mg total) by mouth at bedtime.  30 tablet  1  . ALPRAZolam (XANAX) 1 MG tablet Take 1.5 mg by mouth 3 (three) times daily.      Marland Kitchen oxyCODONE (OXY IR/ROXICODONE) 5 MG immediate release tablet Take 1 tablet (5 mg total) by mouth every 6 (six) hours as needed for pain.  30 tablet  0    No Known Allergies  Review of Systems otherwise noncontributory  BP 130/86  Pulse 80  Resp 18  Ht 5' (1.524 m)  Wt 177 lb (80.287 kg)  BMI 34.57 kg/m2  SpO2 95% Physical Exam general-well-developed female in no acute distress Pulmonary-clear lung fields Cardiac -regular rate and rhythm normal S1-S2 Abdominal exam-soft nontender positive bowel sounds Incisions-healing well without evidence of infection Extremities-trace edema   Diagnostic Tests: A chest x-ray was obtained on today's date. It reveals improved effusion on the left. No evidence of congestive failure or infiltrates.   Impression: She is making good progress. I  feel the nausea is primarily related to medications. For the time being she will continue on amiodarone and iron which are probably the main culprits. She does have acid reflux and I encouraged 3 to use over-the-counter Zantac or toms at this time. Additionally she needs to follow up with her primary physician for this as well. He is scheduled to see Dr. Gery Pray and cardiology followup and hopefully amiodarone can be discontinued soon.   Plan: We will see her again in one month with a repeat chest x-ray.

## 2012-04-06 ENCOUNTER — Ambulatory Visit: Payer: Self-pay | Admitting: Cardiothoracic Surgery

## 2012-05-02 ENCOUNTER — Other Ambulatory Visit: Payer: Self-pay | Admitting: Cardiothoracic Surgery

## 2012-05-02 DIAGNOSIS — I2510 Atherosclerotic heart disease of native coronary artery without angina pectoris: Secondary | ICD-10-CM

## 2012-05-04 ENCOUNTER — Ambulatory Visit: Payer: Self-pay | Admitting: Cardiothoracic Surgery

## 2012-11-09 ENCOUNTER — Encounter: Payer: Self-pay | Admitting: Pharmacist Clinician (PhC)/ Clinical Pharmacy Specialist

## 2013-03-14 ENCOUNTER — Telehealth: Payer: Self-pay | Admitting: Cardiovascular Disease

## 2013-03-14 NOTE — Telephone Encounter (Signed)
Pharmacist says she need an over ride for her Metoprolol -Please call to  [facility] and Marga Hoots [phone]!

## 2013-03-16 NOTE — Telephone Encounter (Signed)
No answer at pharmacy

## 2013-03-20 NOTE — Telephone Encounter (Signed)
No answer at pharmacy

## 2013-03-20 NOTE — Telephone Encounter (Signed)
Pt notified that I have trying to get a hold of pharmacy

## 2013-03-20 NOTE — Telephone Encounter (Signed)
Still have not received her Beta Blocker-need Dr Allyson Sabal to override it!

## 2013-03-21 MED ORDER — METOPROLOL TARTRATE 25 MG PO TABS: 12.5000 mg | ORAL_TABLET | Freq: Two times a day (BID) | ORAL | Status: DC

## 2013-03-21 NOTE — Telephone Encounter (Signed)
Dr Allyson Sabal started metoprolol ER 25mg  QD.  Her insurance will not pay for Er. Can we change it to tartarte?  What dose?

## 2013-05-24 IMAGING — CR DG CHEST 2V
2 series · 2 of 2 positions shown · non-contrast
Comparison: None.

CLINICAL DATA: Shortness of breath and cough.

CHEST - 2 VIEW

[w chest pa]
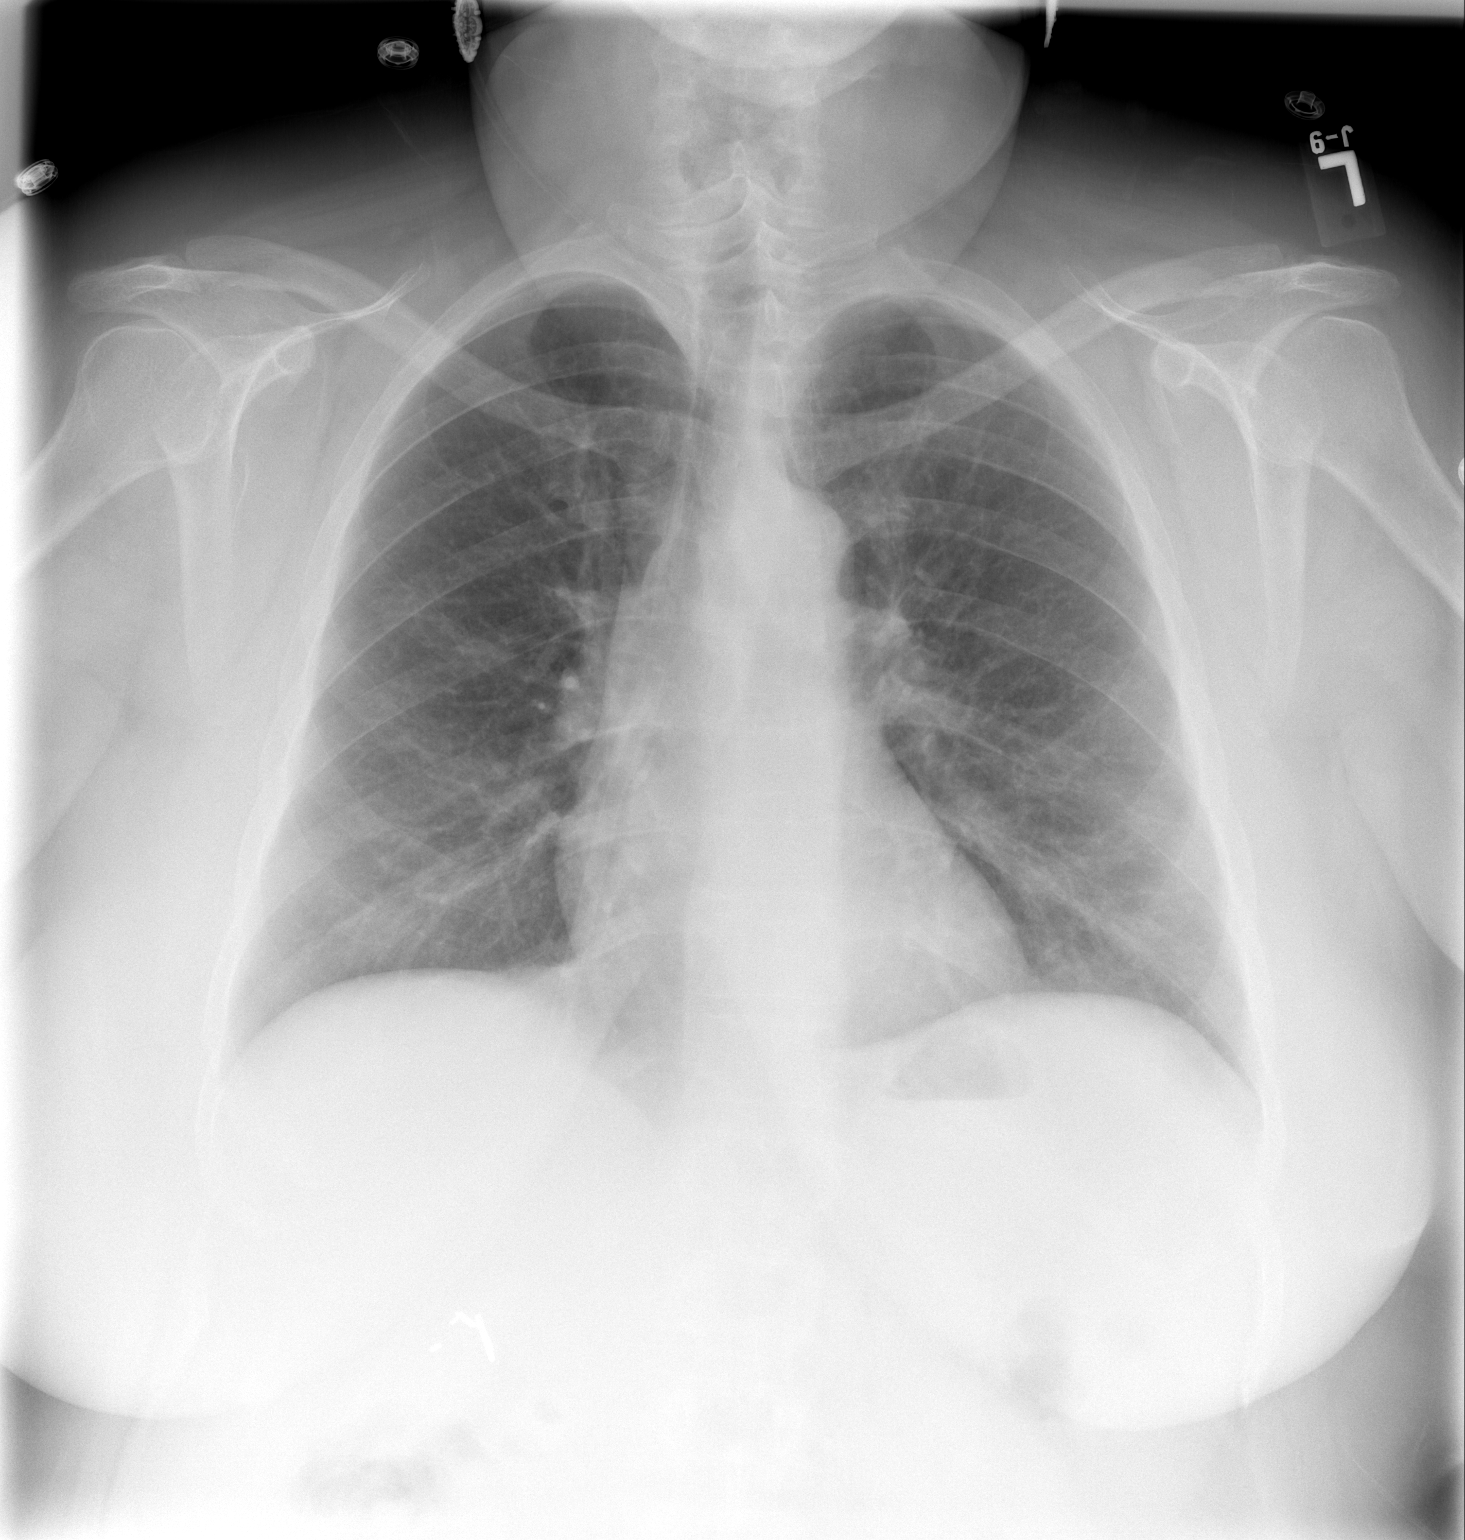

[w chest lat]
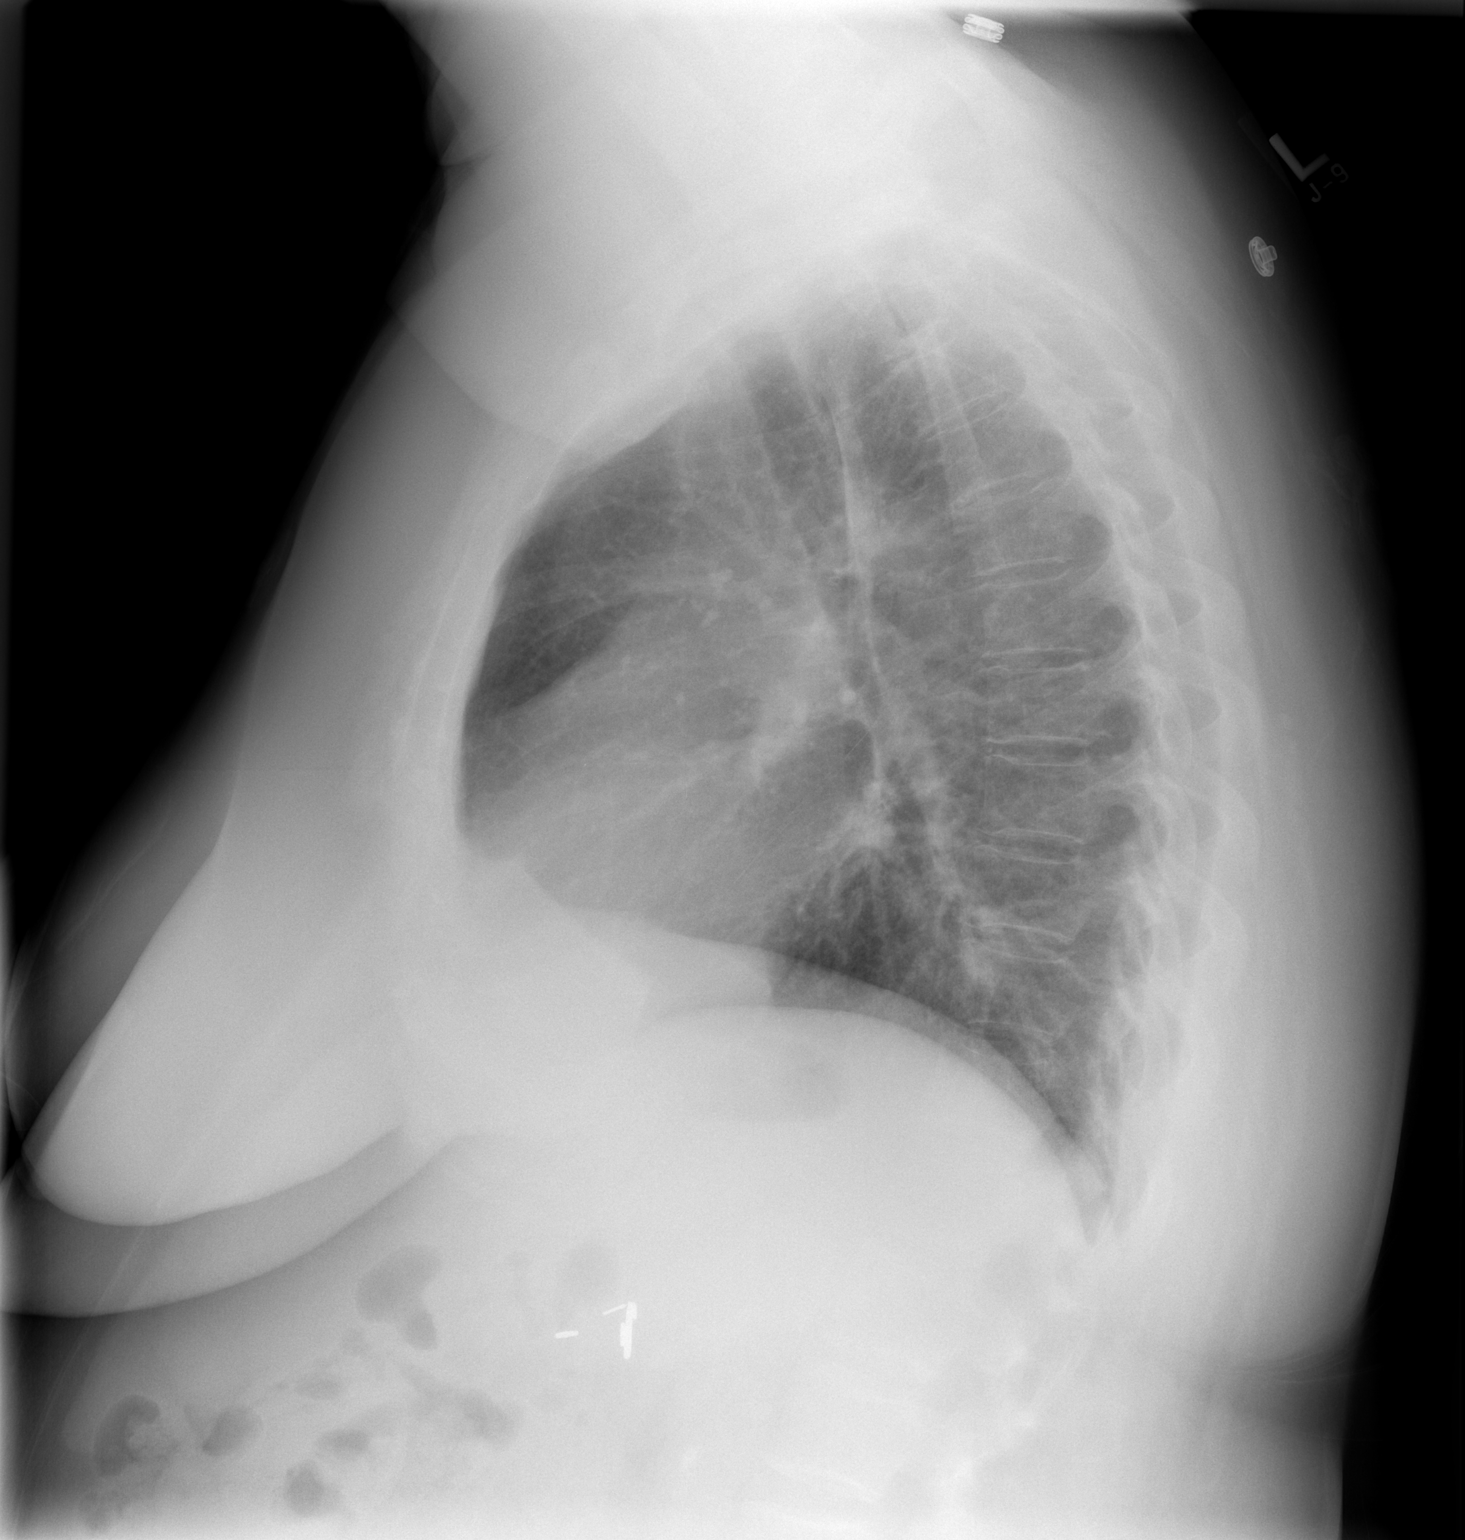

[2 of 2 positions shown; findings below may reference images not displayed]

FINDINGS: Trachea is midline.  Heart size normal.  Lungs are clear.
No pleural fluid.
IMPRESSION: No acute findings.

## 2013-05-27 IMAGING — DX DG CHEST 1V PORT
1 series · 1 of 1 positions shown · non-contrast
Comparison: 03/04/2012

CLINICAL DATA: Postop cardiac surgery.  Prior median sternotomy and
CABG.

PORTABLE CHEST - 1 VIEW

[AP]
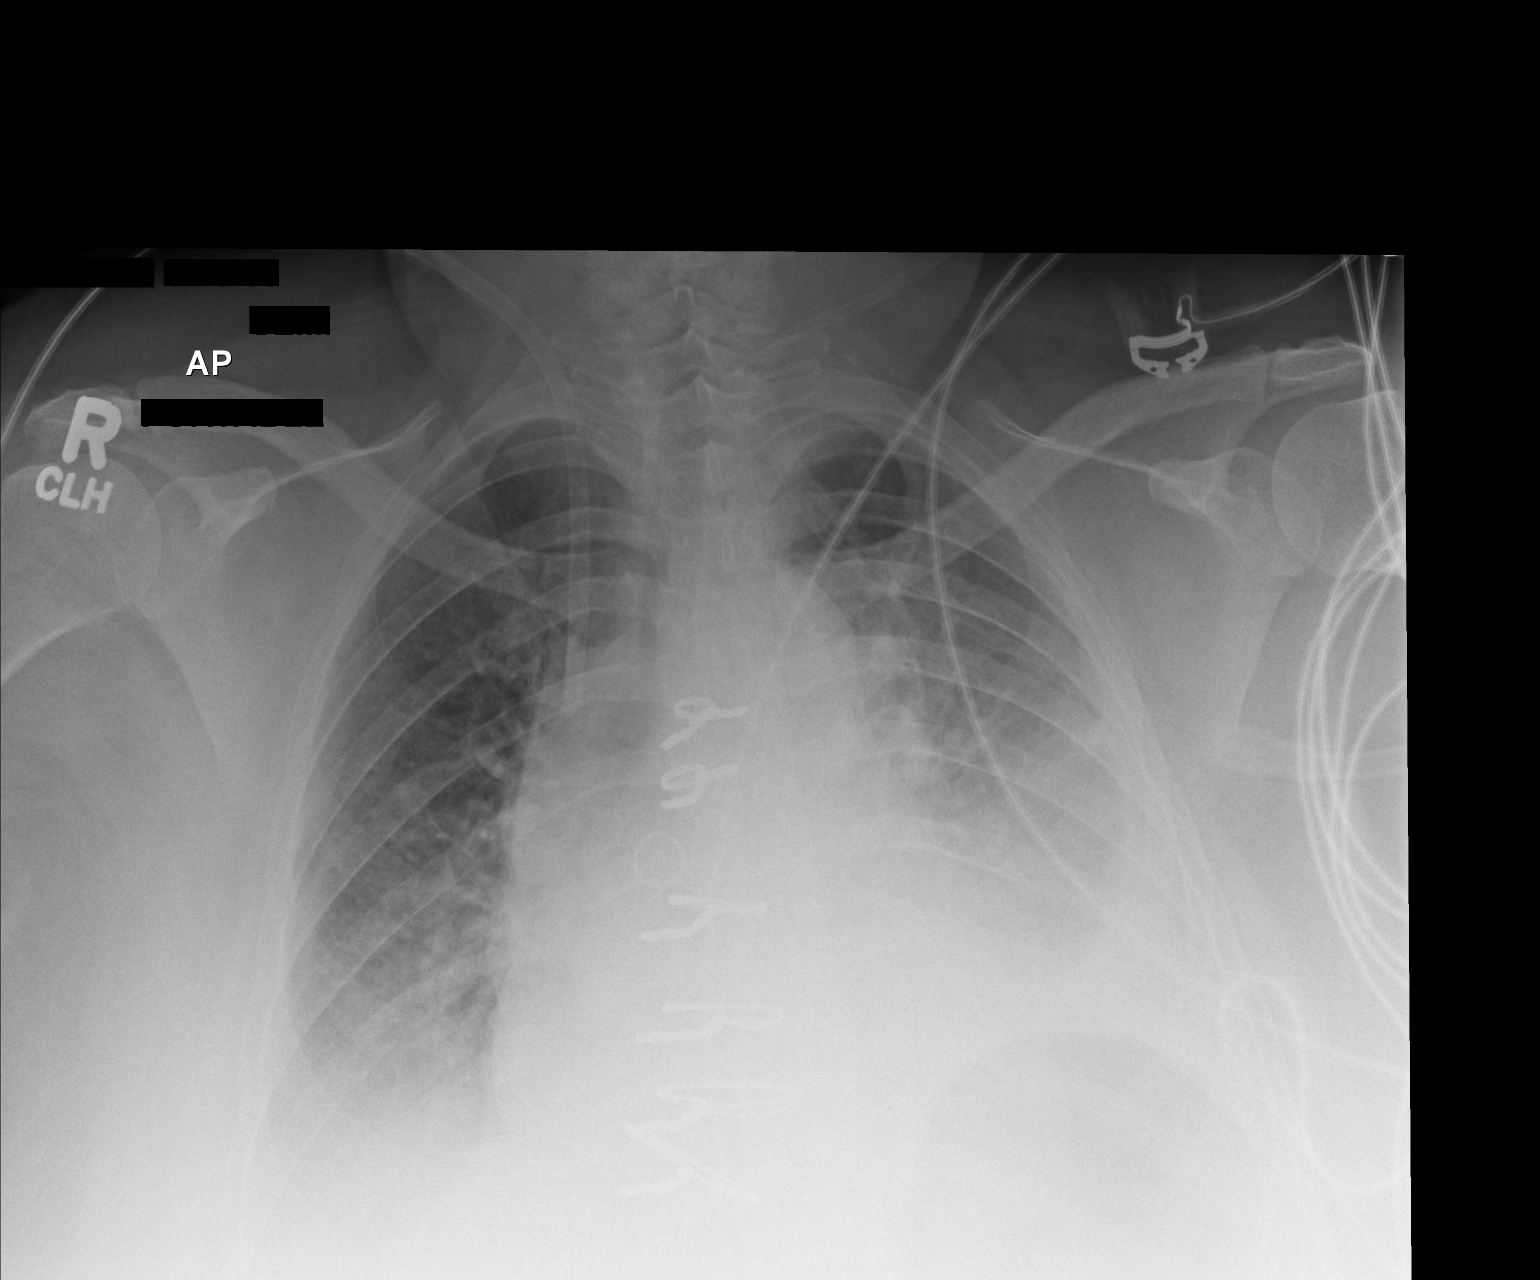

[1 of 1 positions shown; findings below may reference images not displayed]

FINDINGS: Swan-Ganz catheter has been removed.  Right IJ sheath
remains in place.  Endotracheal tube, nasogastric tube have been
removed.  Left chest tube has been removed.

The heart is enlarged.  There are bilateral pleural effusions, left
greater than right.  Bibasilar opacities obscuring hemidiaphragms.
Findings are most consistent with development of pulmonary edema.
There is no evidence for pneumothorax.
IMPRESSION: 1.  Interval removal of nasogastric tube, endotracheal tube, left-
sided chest tube.
2.  No evidence for pneumothorax and
3.  Interval development of pulmonary edema and bibasilar
opacities, left greater than right.
4.  Bilateral pleural effusions.

## 2013-05-29 IMAGING — CR DG CHEST 2V
2 series · 2 of 2 positions shown · non-contrast
Comparison: 03/06/2012

CLINICAL DATA: Left-sided chest pain.  Left effusion.

CHEST - 2 VIEW

[w chest pa]
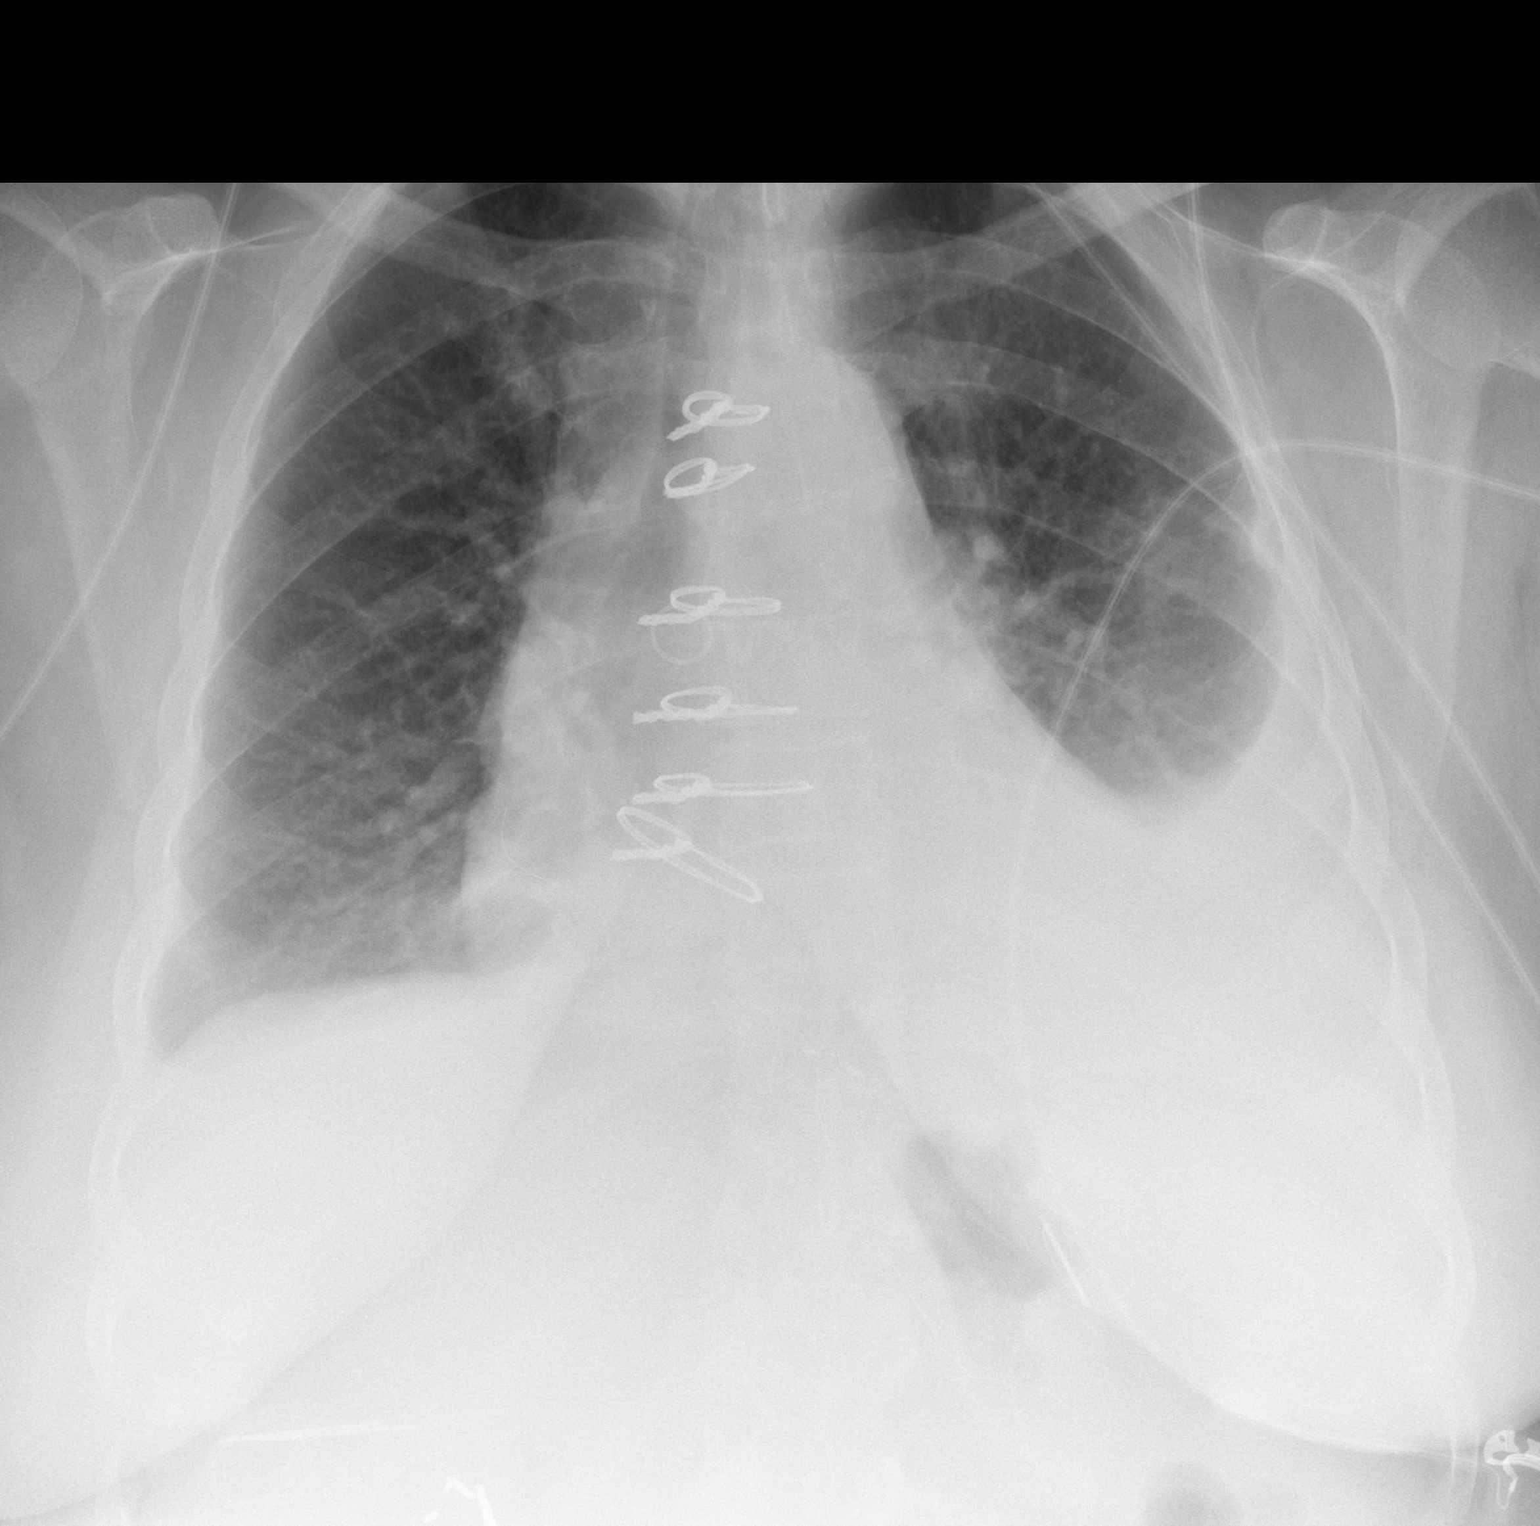

[w chest lat]
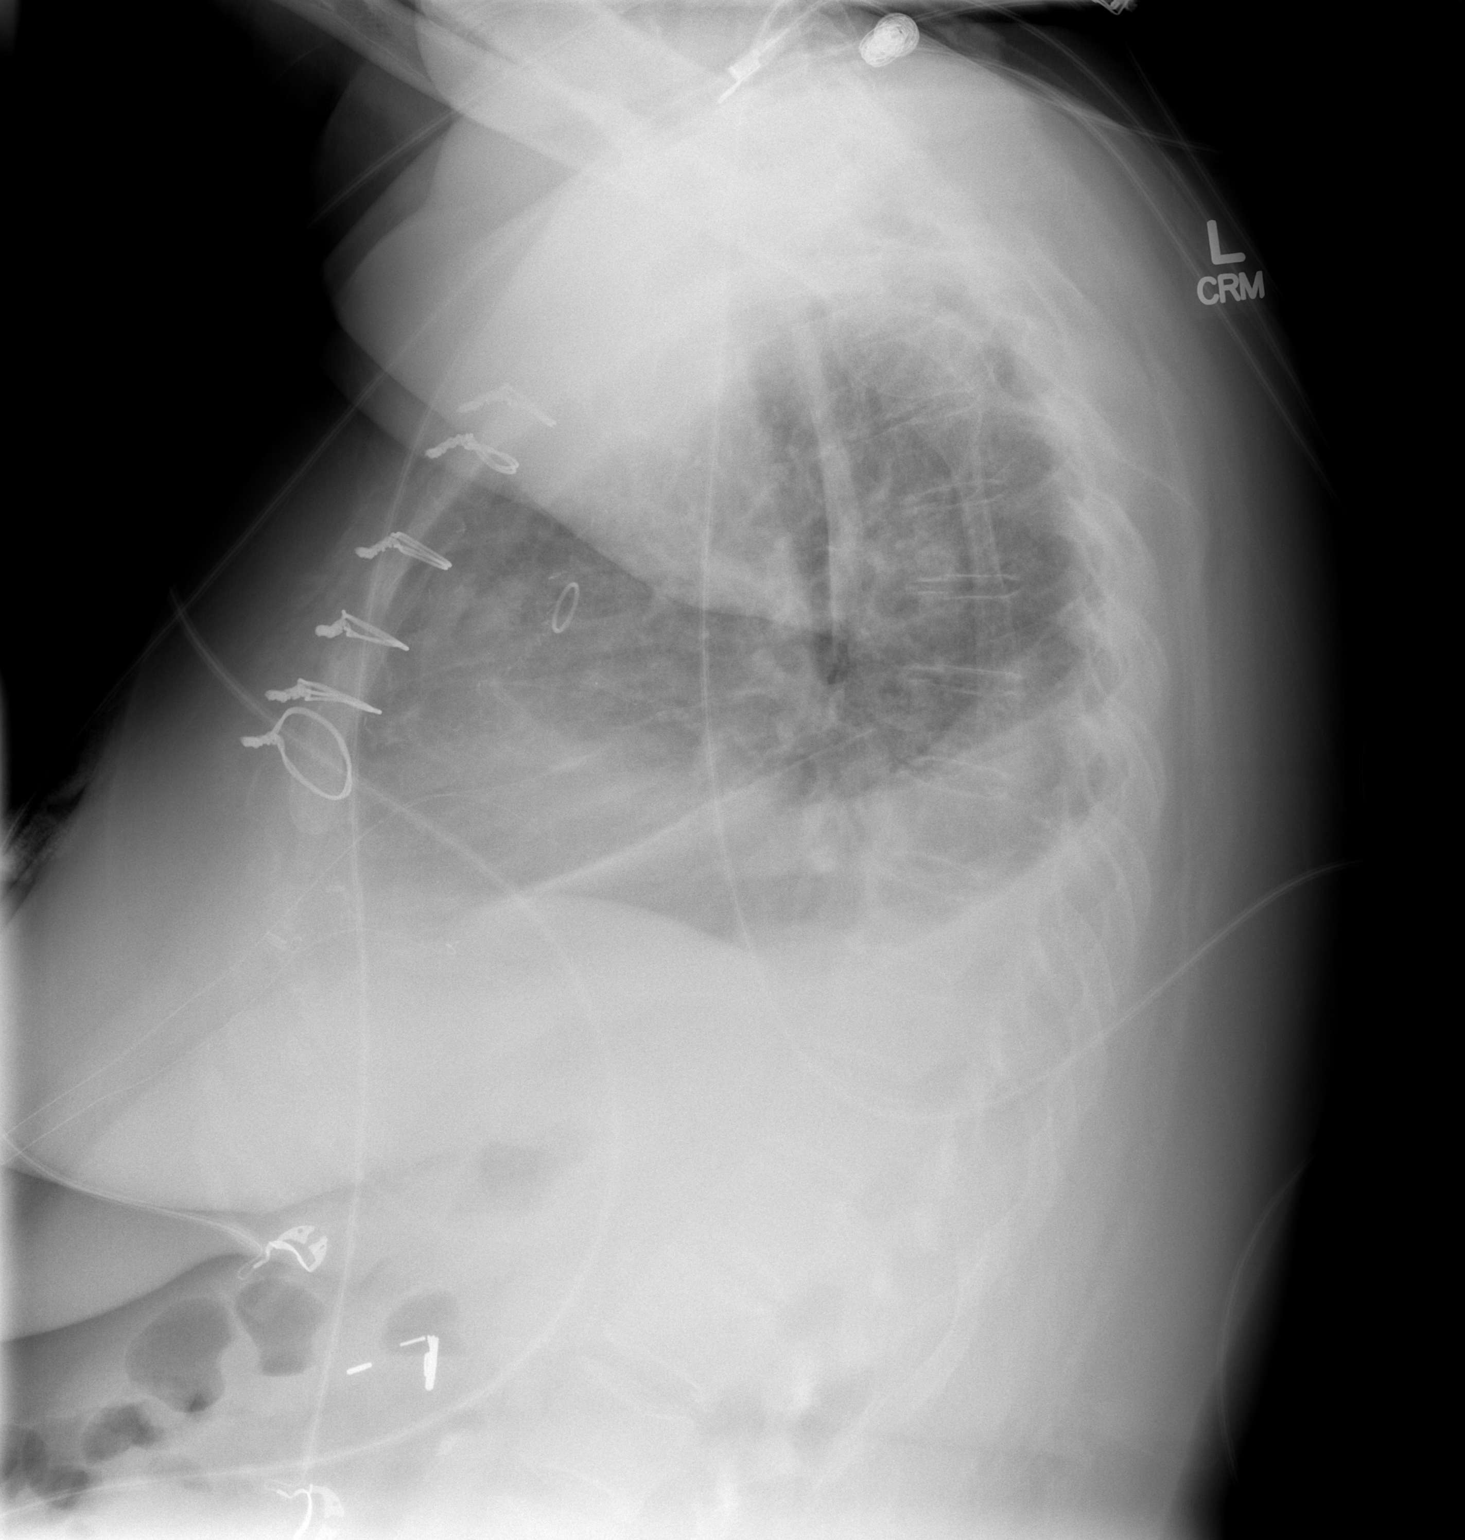

[2 of 2 positions shown; findings below may reference images not displayed]

FINDINGS: There has been significant reduction in the moderate left
pleural effusion.  Small right effusion is essentially unchanged.

Pulmonary vascularity is normal.  Evidence of recent CABG.
IMPRESSION: Significant decrease in the left effusion.  No change of the right
effusion.

## 2013-07-07 ENCOUNTER — Other Ambulatory Visit: Payer: Self-pay | Admitting: Cardiovascular Disease

## 2013-07-07 NOTE — Telephone Encounter (Signed)
Rx was sent to pharmacy electronically. 

## 2013-08-19 ENCOUNTER — Inpatient Hospital Stay (HOSPITAL_COMMUNITY)
Admission: EM | Admit: 2013-08-19 | Discharge: 2013-08-24 | DRG: 189 | Disposition: A | Discharge status: Home / Self Care | Payer: Medicaid Other | Attending: Internal Medicine | Admitting: Internal Medicine

## 2013-08-19 ENCOUNTER — Emergency Department (HOSPITAL_COMMUNITY): Payer: Medicaid Other

## 2013-08-19 ENCOUNTER — Encounter (HOSPITAL_COMMUNITY): Payer: Self-pay | Admitting: Emergency Medicine

## 2013-08-19 ENCOUNTER — Telehealth: Payer: Self-pay | Admitting: Cardiology

## 2013-08-19 DIAGNOSIS — R0902 Hypoxemia: Secondary | ICD-10-CM

## 2013-08-19 DIAGNOSIS — I10 Essential (primary) hypertension: Secondary | ICD-10-CM | POA: Diagnosis present

## 2013-08-19 DIAGNOSIS — I2510 Atherosclerotic heart disease of native coronary artery without angina pectoris: Secondary | ICD-10-CM | POA: Diagnosis present

## 2013-08-19 DIAGNOSIS — K7689 Other specified diseases of liver: Secondary | ICD-10-CM | POA: Diagnosis present

## 2013-08-19 DIAGNOSIS — D6959 Other secondary thrombocytopenia: Secondary | ICD-10-CM | POA: Diagnosis present

## 2013-08-19 DIAGNOSIS — E876 Hypokalemia: Secondary | ICD-10-CM | POA: Diagnosis present

## 2013-08-19 DIAGNOSIS — I480 Paroxysmal atrial fibrillation: Secondary | ICD-10-CM

## 2013-08-19 DIAGNOSIS — J4489 Other specified chronic obstructive pulmonary disease: Secondary | ICD-10-CM | POA: Diagnosis present

## 2013-08-19 DIAGNOSIS — D696 Thrombocytopenia, unspecified: Secondary | ICD-10-CM | POA: Diagnosis present

## 2013-08-19 DIAGNOSIS — Z6841 Body Mass Index (BMI) 40.0 and over, adult: Secondary | ICD-10-CM

## 2013-08-19 DIAGNOSIS — J449 Chronic obstructive pulmonary disease, unspecified: Secondary | ICD-10-CM | POA: Diagnosis present

## 2013-08-19 DIAGNOSIS — I5030 Unspecified diastolic (congestive) heart failure: Secondary | ICD-10-CM | POA: Diagnosis present

## 2013-08-19 DIAGNOSIS — F411 Generalized anxiety disorder: Secondary | ICD-10-CM | POA: Diagnosis present

## 2013-08-19 DIAGNOSIS — F05 Delirium due to known physiological condition: Secondary | ICD-10-CM

## 2013-08-19 DIAGNOSIS — J9620 Acute and chronic respiratory failure, unspecified whether with hypoxia or hypercapnia: Principal | ICD-10-CM | POA: Diagnosis present

## 2013-08-19 DIAGNOSIS — I252 Old myocardial infarction: Secondary | ICD-10-CM

## 2013-08-19 DIAGNOSIS — E669 Obesity, unspecified: Secondary | ICD-10-CM | POA: Diagnosis present

## 2013-08-19 DIAGNOSIS — E119 Type 2 diabetes mellitus without complications: Secondary | ICD-10-CM | POA: Diagnosis present

## 2013-08-19 DIAGNOSIS — Z7982 Long term (current) use of aspirin: Secondary | ICD-10-CM

## 2013-08-19 DIAGNOSIS — J9601 Acute respiratory failure with hypoxia: Secondary | ICD-10-CM | POA: Diagnosis present

## 2013-08-19 DIAGNOSIS — Z8249 Family history of ischemic heart disease and other diseases of the circulatory system: Secondary | ICD-10-CM

## 2013-08-19 DIAGNOSIS — K766 Portal hypertension: Secondary | ICD-10-CM | POA: Diagnosis present

## 2013-08-19 DIAGNOSIS — D509 Iron deficiency anemia, unspecified: Secondary | ICD-10-CM | POA: Diagnosis present

## 2013-08-19 DIAGNOSIS — J90 Pleural effusion, not elsewhere classified: Secondary | ICD-10-CM

## 2013-08-19 DIAGNOSIS — Z79899 Other long term (current) drug therapy: Secondary | ICD-10-CM

## 2013-08-19 DIAGNOSIS — F17200 Nicotine dependence, unspecified, uncomplicated: Secondary | ICD-10-CM | POA: Diagnosis present

## 2013-08-19 DIAGNOSIS — G8929 Other chronic pain: Secondary | ICD-10-CM | POA: Diagnosis present

## 2013-08-19 DIAGNOSIS — K7460 Unspecified cirrhosis of liver: Secondary | ICD-10-CM | POA: Diagnosis present

## 2013-08-19 DIAGNOSIS — R0602 Shortness of breath: Secondary | ICD-10-CM | POA: Diagnosis present

## 2013-08-19 DIAGNOSIS — B1921 Unspecified viral hepatitis C with hepatic coma: Secondary | ICD-10-CM | POA: Diagnosis not present

## 2013-08-19 DIAGNOSIS — D689 Coagulation defect, unspecified: Secondary | ICD-10-CM | POA: Diagnosis present

## 2013-08-19 DIAGNOSIS — K219 Gastro-esophageal reflux disease without esophagitis: Secondary | ICD-10-CM | POA: Diagnosis present

## 2013-08-19 DIAGNOSIS — Z23 Encounter for immunization: Secondary | ICD-10-CM

## 2013-08-19 DIAGNOSIS — I959 Hypotension, unspecified: Secondary | ICD-10-CM | POA: Diagnosis present

## 2013-08-19 DIAGNOSIS — Z823 Family history of stroke: Secondary | ICD-10-CM

## 2013-08-19 DIAGNOSIS — F419 Anxiety disorder, unspecified: Secondary | ICD-10-CM | POA: Diagnosis present

## 2013-08-19 DIAGNOSIS — I214 Non-ST elevation (NSTEMI) myocardial infarction: Secondary | ICD-10-CM

## 2013-08-19 DIAGNOSIS — Z951 Presence of aortocoronary bypass graft: Secondary | ICD-10-CM

## 2013-08-19 DIAGNOSIS — I509 Heart failure, unspecified: Secondary | ICD-10-CM | POA: Diagnosis present

## 2013-08-19 HISTORY — DX: Hypokalemia: E87.6

## 2013-08-19 HISTORY — DX: Hypoxemia: R09.02

## 2013-08-19 LAB — HEPATIC FUNCTION PANEL
AST: 43 U/L — ABNORMAL HIGH (ref 0–37)
Alkaline Phosphatase: 131 U/L — ABNORMAL HIGH (ref 39–117)
Bilirubin, Direct: 0.4 mg/dL — ABNORMAL HIGH (ref 0.0–0.3)
Total Bilirubin: 1.6 mg/dL — ABNORMAL HIGH (ref 0.3–1.2)

## 2013-08-19 LAB — BASIC METABOLIC PANEL
CO2: 43 mEq/L (ref 19–32)
Calcium: 8.6 mg/dL (ref 8.4–10.5)
Creatinine, Ser: 0.52 mg/dL (ref 0.50–1.10)
GFR calc non Af Amer: >90 mL/min (ref >90)
Sodium: 138 mEq/L (ref 135–145)

## 2013-08-19 LAB — CBC WITH DIFFERENTIAL/PLATELET
Basophils Absolute: 0 10*3/uL (ref 0.0–0.1)
Basophils Relative: 0 % (ref 0–1)
Eosinophils Relative: 1 % (ref 0–5)
HCT: 31 % — ABNORMAL LOW (ref 36.0–46.0)
MCHC: 29.4 g/dL — ABNORMAL LOW (ref 30.0–36.0)
MCV: 84.9 fL (ref 78.0–100.0)
Monocytes Absolute: 0.9 10*3/uL (ref 0.1–1.0)
Neutro Abs: 6.8 10*3/uL (ref 1.7–7.7)
Platelets: 85 10*3/uL — ABNORMAL LOW (ref 150–400)
RDW: 18.2 % — ABNORMAL HIGH (ref 11.5–15.5)
WBC: 9.2 10*3/uL (ref 4.0–10.5)

## 2013-08-19 LAB — TROPONIN I: Troponin I: <0.3 ng/mL (ref <0.30)

## 2013-08-19 LAB — MAGNESIUM
Magnesium: 1.8 mg/dL (ref 1.5–2.5)
Magnesium: 2 mg/dL (ref 1.5–2.5)

## 2013-08-19 LAB — POCT I-STAT 3, VENOUS BLOOD GAS (G3P V)
O2 Saturation: 91 %
pCO2, Ven: 77.2 mmHg (ref 45.0–50.0)
pO2, Ven: 64 mmHg — ABNORMAL HIGH (ref 30.0–45.0)

## 2013-08-19 LAB — CG4 I-STAT (LACTIC ACID): Lactic Acid, Venous: 2.31 mmol/L — ABNORMAL HIGH (ref 0.5–2.2)

## 2013-08-19 LAB — PROTIME-INR
INR: 1.19 (ref 0.00–1.49)
Prothrombin Time: 14.8 seconds (ref 11.6–15.2)

## 2013-08-19 LAB — PRO B NATRIURETIC PEPTIDE: Pro B Natriuretic peptide (BNP): 650.2 pg/mL — ABNORMAL HIGH (ref 0–125)

## 2013-08-19 LAB — POCT I-STAT TROPONIN I: Troponin i, poc: 0.02 ng/mL (ref 0.00–0.08)

## 2013-08-19 MED ORDER — PANTOPRAZOLE SODIUM 40 MG PO TBEC
40.0000 mg | DELAYED_RELEASE_TABLET | Freq: Every day | ORAL | Status: DC
  Administered 2013-08-20 – 2013-08-24 (×5): 40 mg via ORAL
  Filled 2013-08-19 (×4): qty 1

## 2013-08-19 MED ORDER — SODIUM CHLORIDE 0.9 % IV SOLN
INTRAVENOUS | Status: DC
  Administered 2013-08-19: 10 mL/h via INTRAVENOUS

## 2013-08-19 MED ORDER — ASPIRIN 81 MG PO CHEW
324.0000 mg | CHEWABLE_TABLET | Freq: Once | ORAL | Status: AC
  Administered 2013-08-19: 324 mg via ORAL
  Filled 2013-08-19: qty 4

## 2013-08-19 MED ORDER — BIOTENE DRY MOUTH MT LIQD
15.0000 mL | Freq: Two times a day (BID) | OROMUCOSAL | Status: DC
  Administered 2013-08-19 – 2013-08-24 (×8): 15 mL via OROMUCOSAL

## 2013-08-19 MED ORDER — MAGNESIUM SULFATE 40 MG/ML IJ SOLN
2.0000 g | Freq: Once | INTRAMUSCULAR | Status: AC
  Administered 2013-08-19: 2 g via INTRAVENOUS
  Filled 2013-08-19: qty 50

## 2013-08-19 MED ORDER — OXYCODONE HCL 5 MG PO TABS
10.0000 mg | ORAL_TABLET | Freq: Three times a day (TID) | ORAL | Status: DC
  Administered 2013-08-19: 10 mg via ORAL
  Filled 2013-08-19: qty 2

## 2013-08-19 MED ORDER — POTASSIUM CHLORIDE CRYS ER 20 MEQ PO TBCR
40.0000 meq | EXTENDED_RELEASE_TABLET | Freq: Once | ORAL | Status: AC
  Administered 2013-08-19: 40 meq via ORAL
  Filled 2013-08-19: qty 2

## 2013-08-19 MED ORDER — ACETAMINOPHEN 325 MG PO TABS
650.0000 mg | ORAL_TABLET | ORAL | Status: DC | PRN
  Administered 2013-08-20 – 2013-08-22 (×2): 650 mg via ORAL
  Filled 2013-08-19 (×2): qty 2

## 2013-08-19 MED ORDER — METOPROLOL TARTRATE 12.5 MG HALF TABLET
12.5000 mg | ORAL_TABLET | Freq: Two times a day (BID) | ORAL | Status: DC
  Filled 2013-08-19 (×3): qty 1

## 2013-08-19 MED ORDER — HEPARIN BOLUS VIA INFUSION
4000.0000 [IU] | Freq: Once | INTRAVENOUS | Status: AC
  Administered 2013-08-19: 4000 [IU] via INTRAVENOUS
  Filled 2013-08-19: qty 4000

## 2013-08-19 MED ORDER — FUROSEMIDE 10 MG/ML IJ SOLN
40.0000 mg | Freq: Once | INTRAMUSCULAR | Status: AC
  Administered 2013-08-19: 40 mg via INTRAVENOUS
  Filled 2013-08-19: qty 4

## 2013-08-19 MED ORDER — SIMVASTATIN 20 MG PO TABS
20.0000 mg | ORAL_TABLET | Freq: Every day | ORAL | Status: DC
  Administered 2013-08-20 – 2013-08-21 (×2): 20 mg via ORAL
  Filled 2013-08-19 (×2): qty 1

## 2013-08-19 MED ORDER — OXYCODONE HCL 10 MG PO TABS: 10.0000 mg | ORAL_TABLET | Freq: Three times a day (TID) | ORAL | Status: DC

## 2013-08-19 MED ORDER — SODIUM CHLORIDE 0.9 % IV BOLUS (SEPSIS)
500.0000 mL | Freq: Once | INTRAVENOUS | Status: AC
  Administered 2013-08-19: 500 mL via INTRAVENOUS

## 2013-08-19 MED ORDER — POTASSIUM CHLORIDE 10 MEQ/100ML IV SOLN
10.0000 meq | Freq: Once | INTRAVENOUS | Status: AC
  Administered 2013-08-19: 10 meq via INTRAVENOUS
  Filled 2013-08-19: qty 100

## 2013-08-19 MED ORDER — ONDANSETRON HCL 4 MG/2ML IJ SOLN
4.0000 mg | Freq: Four times a day (QID) | INTRAMUSCULAR | Status: DC | PRN
  Administered 2013-08-20 (×2): 4 mg via INTRAVENOUS
  Filled 2013-08-19 (×2): qty 2

## 2013-08-19 MED ORDER — NITROGLYCERIN 0.4 MG SL SUBL: 0.4000 mg | SUBLINGUAL_TABLET | SUBLINGUAL | Status: DC | PRN

## 2013-08-19 MED ORDER — ALPRAZOLAM 0.5 MG PO TABS
1.5000 mg | ORAL_TABLET | Freq: Three times a day (TID) | ORAL | Status: DC
  Filled 2013-08-19: qty 3

## 2013-08-19 MED ORDER — ASPIRIN EC 81 MG PO TBEC
81.0000 mg | DELAYED_RELEASE_TABLET | Freq: Every day | ORAL | Status: DC
  Administered 2013-08-20 – 2013-08-24 (×5): 81 mg via ORAL
  Filled 2013-08-19 (×5): qty 1

## 2013-08-19 MED ORDER — FUROSEMIDE 40 MG PO TABS
40.0000 mg | ORAL_TABLET | Freq: Every day | ORAL | Status: DC
  Filled 2013-08-19 (×2): qty 1

## 2013-08-19 MED ORDER — ALBUTEROL SULFATE HFA 108 (90 BASE) MCG/ACT IN AERS: 2.0000 | INHALATION_SPRAY | Freq: Four times a day (QID) | RESPIRATORY_TRACT | Status: DC | PRN

## 2013-08-19 MED ORDER — INFLUENZA VAC SPLIT QUAD 0.5 ML IM SUSP
0.5000 mL | INTRAMUSCULAR | Status: AC
  Administered 2013-08-23: 16:00:00 0.5 mL via INTRAMUSCULAR
  Filled 2013-08-19 (×2): qty 0.5

## 2013-08-19 MED ORDER — POLYSACCHARIDE IRON COMPLEX 150 MG PO CAPS
150.0000 mg | ORAL_CAPSULE | Freq: Every day | ORAL | Status: DC
  Administered 2013-08-20 – 2013-08-24 (×5): 150 mg via ORAL
  Filled 2013-08-19 (×5): qty 1

## 2013-08-19 MED ORDER — HEPARIN (PORCINE) IN NACL 100-0.45 UNIT/ML-% IJ SOLN
1100.0000 [IU]/h | INTRAMUSCULAR | Status: DC
  Administered 2013-08-19: 23:00:00 1000 [IU]/h via INTRAVENOUS
  Administered 2013-08-20: 1100 [IU]/h via INTRAVENOUS
  Filled 2013-08-19 (×2): qty 250

## 2013-08-19 NOTE — ED Provider Notes (Signed)
MSE was initiated and I personally evaluated the patient and placed orders (if any) at  2:54 PM on August 19, 2013.  The patient appears stable so that the remainder of the MSE may be completed by another provider.  Patient with worsening shortness of breath x2 weeks as well as lower extremity edema. Mild rhonchi on exam. Suspect CHF and labs have been ordered  Toy Baker, MD 08/19/13 1455

## 2013-08-19 NOTE — ED Notes (Signed)
Family at bedside. 

## 2013-08-19 NOTE — H&P (Signed)
Victoria Bell is an 52 y.o. female.    Primary Cardiologist:BERRY,JONATHAN J, MD  Chief Complaint: Shortness of Breath, desat 75% on arrival  HPI: 52 y.o. female with a history of coronary artery disease, status post MI about 12 years ago, treated at [facility].  The patient was found to have elevated troponins in 02/2012 at her hospital was transferred to [facility] for cardiology work up, as there were no beds available at the hospitals surrounding [place].  Cardiac catheterization revealed 99% distal left main stenosis with normal LV function. She underwent coronary artery bypass grafting x2 with Dr. Zenaida Niece tripe 03/03/2012 LIMA to the LAD and vein graft to the circumflex. She did have postop A. fib and persistent left pleural effusion. She was also hypertensive at times.  She has a history of hepatitis C.  She was last seen by Dr. Allyson Sabal March 2014 at that time she had started smoking again. She was instructed to stop and she was started on low-dose beta blocker.  Today her sister called stating that her oxygen saturations were low they had gone to their local hospital and were told she was oversedated with medication she was taking for dizziness and an inner ear infection. They also told her she had volume overload. Her sister who has oxygen in her home was checking  oxygen saturation and sats were in the 70s so overnight gave her sister oxygen which improved her oxygen level.  Today they called very unhappy with their local hospital I instructed them to come to [facility] for further evaluation.  She's had gradual onset of increasing dyspnea she also complains of mild burning in her chest. She's been very fatigued.  She also has diabetes mellitus.  Here in the ER Pro BNP 650, troponin point care markers 0.02 lactic gas at 2.31, hemoglobin is also low at 9.1, she was hypokalemic with potassium of 3.2  EKG:  Past Medical History  Diagnosis Date  . Hypertension   . Anxiety    . GERD (gastroesophageal reflux disease)   . Myocardial infarction     2001  . Hepatitis C   . Diabetes mellitus     type 2    Past Surgical History  Procedure Laterality Date  . Cardiac catheterization  03/02/12    99% left main stenosis distally, normal LV function  . Cholecystectomy    . Tubal ligation    . Coronary artery bypass graft  03/03/2012    Procedure: CORONARY ARTERY BYPASS GRAFTING (CABG);  Surgeon: Kerin Perna, MD;  Location: [facility] OR;  Service: Open Heart Surgery;  Laterality: N/A;    Family History  Problem Relation Age of Onset  . Coronary artery disease Father 31    MI/CABG   Social History:  reports that she has been smoking Cigarettes.  She has a 60 pack-year smoking history. She does not have any smokeless tobacco history on file. She reports that she does not drink alcohol or use illicit drugs.  Allergies: No Known Allergies  Outpatient medications Albuterol inhaler. Xanax 1.5 mg by mouth 3 times a day Aspirin 81 mg daily Lasix 40 mg daily Antivert 12 have milligrams twice a day. Lopressor half a tablet twice a day of 25 mg equal 2.5 twice a day Zofran tablets 40 mg every 8 hours as needed next number oxycodone 10 mg 3 times a day Protonix 40 mg daily Zocor 20 mg daily   Results for orders placed  during the hospital encounter of 08/19/13 (from the past 48 hour(s))  BASIC METABOLIC PANEL     Status: Abnormal   Collection Time    08/19/13  2:40 PM      Result Value Range   Sodium 138  135 - 145 mEq/L   Potassium 3.2 (*) 3.5 - 5.1 mEq/L   Chloride 89 (*) 96 - 112 mEq/L   CO2 43 (*) 19 - 32 mEq/L   Comment: CRITICAL RESULT CALLED TO, READ BACK BY AND VERIFIED WITH:     THOMPSON,B RN 08/19/13 1606 WOOTEN,K   Glucose, Bld 168 (*) 70 - 99 mg/dL   BUN 13  6 - 23 mg/dL   Creatinine, Ser 9.60  0.50 - 1.10 mg/dL   Calcium 8.6  8.4 - 45.4 mg/dL   GFR calc non Af Amer >90  >90 mL/min   GFR calc Af Amer >90  >90 mL/min   Comment: (NOTE)     The eGFR  has been calculated using the CKD EPI equation.     This calculation has not been validated in all clinical situations.     eGFR's persistently <90 mL/min signify possible Chronic Kidney     Disease.  PRO B NATRIURETIC PEPTIDE     Status: Abnormal   Collection Time    08/19/13  2:40 PM      Result Value Range   Pro B Natriuretic peptide (BNP) 650.2 (*) 0 - 125 pg/mL  CBC WITH DIFFERENTIAL     Status: Abnormal   Collection Time    08/19/13  2:40 PM      Result Value Range   WBC 9.2  4.0 - 10.5 K/uL   RBC 3.65 (*) 3.87 - 5.11 MIL/uL   Hemoglobin 9.1 (*) 12.0 - 15.0 g/dL   HCT 09.8 (*) 11.9 - 14.7 %   MCV 84.9  78.0 - 100.0 fL   MCH 24.9 (*) 26.0 - 34.0 pg   MCHC 29.4 (*) 30.0 - 36.0 g/dL   RDW 82.9 (*) 56.2 - 13.0 %   Platelets 85 (*) 150 - 400 K/uL   Comment: PLATELET COUNT CONFIRMED BY SMEAR   Neutrophils Relative % 74  43 - 77 %   Neutro Abs 6.8  1.7 - 7.7 K/uL   Lymphocytes Relative 15  12 - 46 %   Lymphs Abs 1.4  0.7 - 4.0 K/uL   Monocytes Relative 10  3 - 12 %   Monocytes Absolute 0.9  0.1 - 1.0 K/uL   Eosinophils Relative 1  0 - 5 %   Eosinophils Absolute 0.1  0.0 - 0.7 K/uL   Basophils Relative 0  0 - 1 %   Basophils Absolute 0.0  0.0 - 0.1 K/uL  MAGNESIUM     Status: None   Collection Time    08/19/13  2:40 PM      Result Value Range   Magnesium 1.8  1.5 - 2.5 mg/dL  HEPATIC FUNCTION PANEL     Status: Abnormal   Collection Time    08/19/13  2:40 PM      Result Value Range   Total Protein 6.8  6.0 - 8.3 g/dL   Albumin 2.9 (*) 3.5 - 5.2 g/dL   AST 43 (*) 0 - 37 U/L   ALT 23  0 - 35 U/L   Alkaline Phosphatase 131 (*) 39 - 117 U/L   Total Bilirubin 1.6 (*) 0.3 - 1.2 mg/dL   Bilirubin, Direct 0.4 (*) 0.0 - 0.3  mg/dL   Indirect Bilirubin 1.2 (*) 0.3 - 0.9 mg/dL  POCT I-STAT TROPONIN I     Status: None   Collection Time    08/19/13  3:10 PM      Result Value Range   Troponin i, poc 0.02  0.00 - 0.08 ng/mL   Comment 3            Comment: Due to the release  kinetics of cTnI,     a negative result within the first hours     of the onset of symptoms does not rule out     myocardial infarction with certainty.     If myocardial infarction is still suspected,     repeat the test at appropriate intervals.  CG4 I-STAT (LACTIC ACID)     Status: Abnormal   Collection Time    08/19/13  3:19 PM      Result Value Range   Lactic Acid, Venous 2.31 (*) 0.5 - 2.2 mmol/L  POCT I-STAT 3, BLOOD GAS (G3P V)     Status: Abnormal   Collection Time    08/19/13  3:37 PM      Result Value Range   pH, Ven 7.392 (*) 7.250 - 7.300   pCO2, Ven 77.2 (*) 45.0 - 50.0 mmHg   pO2, Ven 64.0 (*) 30.0 - 45.0 mmHg   Bicarbonate 46.9 (*) 20.0 - 24.0 mEq/L   TCO2 49  0 - 100 mmol/L   O2 Saturation 91.0     Acid-Base Excess 18.0 (*) 0.0 - 2.0 mmol/L   Sample type VENOUS     Comment NOTIFIED PHYSICIAN     Dg Chest Port 1 View  08/19/2013   CLINICAL DATA:  Shortness of breath  EXAM: PORTABLE CHEST - 1 VIEW  COMPARISON:  04/04/2012  FINDINGS: Status post CABG. Mild cardiac enlargement. Mild to moderate vascular congestion. No definite pulmonary edema currently.  IMPRESSION: Cardiomegaly with vascular congestion.   Electronically Signed   By: Esperanza Heir M.D.   On: 08/19/2013 15:32    ROS: General:no colds or fevers, increase of weight  Skin:no rashes or ulcers HEENT:no blurred vision, no congestion CV:see HPI PUL:see HPI GI:no diarrhea, chronic constipation or melena, no indigestion GU:no hematuria, no dysuria MS:no joint pain, no claudication Neuro:no syncope, no lightheadedness. Lethargic at home Endo:+ diabetes- pt denies diabetes will check, no thyroid disease  + snoring, tired during the day.   Blood pressure 92/40, pulse 86, temperature 97.9 F (36.6 C), temperature source Oral, resp. rate 15, height 5\' 1"  (1.549 m), weight 212 lb (96.163 kg), SpO2 97.00%. PE: General:Pleasant affect, NAD Skin:Warm and dry, brisk capillary refill HEENT:normocephalic, sclera  clear, mucus membranes moist Neck:supple, no JVD, no bruits  Heart:S1S2 RRR without murmur, gallup, rub or click Lungs:clear without rales, rhonchi, or wheezes ZOX:WRUEA, soft, non tender, + BS, do not palpate liver spleen or masses Ext:no lower ext edema, 2+ pedal pulses, 2+ radial pulses Neuro:alert and oriented, MAE, follows commands, + facial symmetry    Assessment/Plan Principal Problem:   Hypoxia Active Problems:   CAD, ? Stent at [facility] in 2001, cath 03/02/12 with 95% Lt. Main   S/P CABG x 2, 03/03/12 LIMA to LAD and VG to OM   Anxiety disorder   COPD (chronic obstructive pulmonary disease)   Obesity   SOB (shortness of breath)  PLAN: admit  IV heparin, serial troponin, continue oxygen. May need stress test vs cath depending on enzymes.  [facility] R Nurse Practitioner Certified [facility] Pager [phone] 08/19/2013, 6:41 PM    Patient seen and examined. Agree with assessment and plan. 52 yo W obese female with h/o CAD and MI  commencing at age 79. She continued to smoke and underwent CABG in May 2013. Of note, pt never had chest pain prior to her CABG. She resumed smoking 3 mo later. She admits to increased sob, dizziness without chest pain. She has had oxygen desaturation. Hx is highly suggestive of OSA with loud snoring, frequent awakenings, non-restorative sleep and residual daytime sleepiness. I suspect she is having significant nocturnal oxygen desaturation. She now presents with mild CHF/hypoxia. ECG without acute changes. Will admit, cycle enzymes, start empiric heparin. Discussed smoking cessation. May need myoview vs repeat cath. Plan also for outpatient sleep study.   Lennette Bihari, MD, [facility] 08/19/2013 7:16 PM

## 2013-08-19 NOTE — Telephone Encounter (Signed)
Pt's sister called, pt with spo2 in the 70's with increased edema, was seen at [facility] and was told per the sister that she had fluid but was also sedated due to her meds.  She was sent home.  Spo2 continues to drop and her sister gave the pt her oxygen to wear.  I asked them to come to [facility] ER to be seen.  The sister will bring.

## 2013-08-19 NOTE — ED Provider Notes (Signed)
CSN: [id number]     Arrival date & time 08/19/13  1417 History   None    Chief Complaint  Patient presents with  . Shortness of Breath   (Consider location/radiation/quality/duration/timing/severity/associated sxs/prior Treatment) HPI Onset was gradual, present for weeks with worsening since yesterday.  The pain mild burning in chest. Modifying factors: better with oxygen.  Associated symptoms: fatigue.  Recent medical care: went to [place] ED last night for AMS and was told she is taking too much xanax and oxycodone and discharged home.   Past Medical History  Diagnosis Date  . Hypertension   . Anxiety   . GERD (gastroesophageal reflux disease)   . Myocardial infarction     2001  . Hepatitis C   . Diabetes mellitus     type 2  . Hypoxia 08/19/2013  . Hypokalemia 08/19/2013   Past Surgical History  Procedure Laterality Date  . Cardiac catheterization  03/02/12    99% left main stenosis distally, normal LV function  . Cholecystectomy    . Tubal ligation    . Coronary artery bypass graft  03/03/2012    Procedure: CORONARY ARTERY BYPASS GRAFTING (CABG);  Surgeon: Kerin Perna, MD;  Location: [facility] OR;  Service: Open Heart Surgery;  Laterality: N/A;   Family History  Problem Relation Age of Onset  . Coronary artery disease Father 40    MI/CABG  . Heart attack Father   . Stroke Father   . Stroke Mother    History  Substance Use Topics  . Smoking status: Current Every Day Smoker -- 2.00 packs/day for 30 years    Types: Cigarettes  . Smokeless tobacco: Not on file  . Alcohol Use: No   OB History   Grav Para Term Preterm Abortions TAB SAB Ect Mult Living                 Review of Systems Constitutional: Negative for fever.  Eyes: Negative for vision loss.  ENT: Negative for difficulty swallowing.  Cardiovascular: Positive for leg swelling. Respiratory: Negative for respiratory distress.  Gastrointestinal:  Negative for vomiting.  Genitourinary: Negative for inability  to void.  Musculoskeletal: Negative for gait problem.  Integumentary: Negative for rash.  Neurological: Negative for new focal weakness.     Allergies  Review of patient's allergies indicates no known allergies.  Home Medications   Current Outpatient Rx  Name  Route  Sig  Dispense  Refill  . albuterol (PROVENTIL HFA;VENTOLIN HFA) 108 (90 BASE) MCG/ACT inhaler   Inhalation   Inhale 2 puffs into the lungs every 6 (six) hours as needed for wheezing or shortness of breath.         . ALPRAZolam (XANAX) 1 MG tablet   Oral   Take 1.5 mg by mouth 3 (three) times daily.         Marland Kitchen aspirin EC 81 MG tablet   Oral   Take 81 mg by mouth daily.         . furosemide (LASIX) 40 MG tablet   Oral   Take 1 tablet (40 mg total) by mouth daily with breakfast.   7 tablet   0   . meclizine (ANTIVERT) 12.5 MG tablet   Oral   Take 12.5 mg by mouth 2 (two) times daily as needed for dizziness.         . metoprolol tartrate (LOPRESSOR) 25 MG tablet   Oral   Take 0.5 tablets (12.5 mg total) by mouth 2 (two) times daily.  90 tablet   1   . ondansetron (ZOFRAN) 8 MG tablet   Oral   Take 4-8 mg by mouth every 8 (eight) hours as needed for nausea or vomiting.         . Oxycodone HCl 10 MG TABS   Oral   Take 10 mg by mouth 3 (three) times daily.         Marland Kitchen oxymetazoline (AFRIN) 0.05 % nasal spray   Each Nare   Place 1 spray into both nostrils 2 (two) times daily.         . pantoprazole (PROTONIX) 40 MG tablet   Oral   Take 40 mg by mouth daily.         . simvastatin (ZOCOR) 20 MG tablet   Oral   Take 20 mg by mouth daily.         Marland Kitchen EXPIRED: iron polysaccharides (NIFEREX) 150 MG capsule   Oral   Take 1 capsule (150 mg total) by mouth daily.   30 capsule   0    BP 87/31  Pulse 86  Temp(Src) 97.9 F (36.6 C) (Oral)  Resp 18  Ht 5\' 1"  (1.549 m)  Wt 212 lb (96.163 kg)  BMI 40.08 kg/m2  SpO2 96% Physical Exam Nursing note and vitals reviewed.  Constitutional:  Pt is alert and appears stated age. Eyes: No injection, no scleral icterus. HENT: Atraumatic, airway open without erythema or exudate.  Respiratory: No respiratory distress. Equal breathing bilaterally. Bibasilar rales. Cardiovascular: Normal rate. Extremities warm and well perfused.  Abdomen: Soft, non-tender. MSK: Extremities are atraumatic without deformity. Skin: No rash, no wounds.   Neuro: No motor nor sensory deficit. GCS 15.     ED Course  Procedures (including critical care time) Labs Review Labs Reviewed  BASIC METABOLIC PANEL - Abnormal; Notable for the following:    Potassium 3.2 (*)    Chloride 89 (*)    CO2 43 (*)    Glucose, Bld 168 (*)    All other components within normal limits  PRO B NATRIURETIC PEPTIDE - Abnormal; Notable for the following:    Pro B Natriuretic peptide (BNP) 650.2 (*)    All other components within normal limits  CBC WITH DIFFERENTIAL - Abnormal; Notable for the following:    RBC 3.65 (*)    Hemoglobin 9.1 (*)    HCT 31.0 (*)    MCH 24.9 (*)    MCHC 29.4 (*)    RDW 18.2 (*)    Platelets 85 (*)    All other components within normal limits  HEPATIC FUNCTION PANEL - Abnormal; Notable for the following:    Albumin 2.9 (*)    AST 43 (*)    Alkaline Phosphatase 131 (*)    Total Bilirubin 1.6 (*)    Bilirubin, Direct 0.4 (*)    Indirect Bilirubin 1.2 (*)    All other components within normal limits  CG4 I-STAT (LACTIC ACID) - Abnormal; Notable for the following:    Lactic Acid, Venous 2.31 (*)    All other components within normal limits  POCT I-STAT 3, BLOOD GAS (G3P V) - Abnormal; Notable for the following:    pH, Ven 7.392 (*)    pCO2, Ven 77.2 (*)    pO2, Ven 64.0 (*)    Bicarbonate 46.9 (*)    Acid-Base Excess 18.0 (*)    All other components within normal limits  MAGNESIUM  POCT I-STAT TROPONIN I   Imaging Review Dg Chest Port 1  View  08/19/2013   CLINICAL DATA:  Shortness of breath  EXAM: PORTABLE CHEST - 1 VIEW   COMPARISON:  04/04/2012  FINDINGS: Status post CABG. Mild cardiac enlargement. Mild to moderate vascular congestion. No definite pulmonary edema currently.  IMPRESSION: Cardiomegaly with vascular congestion.   Electronically Signed   By: Esperanza Heir M.D.   On: 08/19/2013 15:32    EKG Interpretation   None       MDM   1. SOB (shortness of breath)   2. Hypoxia   3. Hypokalemia    52 y.o. female w/ PMHx of CABG, HTN, DM presents w/ SOB, hypotension, new oxygen requirement, fatigue. Afebrile. Not c/w sepsis or PE. Concerned for cardiac pathology. ASA ordered along with small fluid bolus.  EKG with prolonged NSR, regular rate, QTc prolonged, no ST elevation/depression.CXR with congestion, no consolidation. VBG with hypercarbia, compensated will not place on bipap. Lactic acid slightly elevated. Troponin neg. Magnesium less than 2. CBC with slight anemia 9.1. BNP elevated at 650. BMP with low K, replacement ordered. [facility] cardiology consulted, will come see patient. On re-eval SBP >90, pt without acute complaint.   Pt re-evaluated several times without acute event while awaiting for cardiology.   Cardiology with plan for admission.     I independently viewed, interpreted, and used in my medical decision making all ordered lab and imaging tests. Medical Decision Making discussed with ED attending Rolan Bucco, MD      Charm Barges, MD 08/19/13 [phone]

## 2013-08-19 NOTE — ED Notes (Signed)
PT was seen at hospital in [place] for altered mental status.  Was told that she had taken too much xanax and oxycodone and was sent home.  Pt with sats of 75 RA in triage, though able to converse.

## 2013-08-19 NOTE — ED Provider Notes (Signed)
I saw and evaluated the patient, reviewed the resident's note and I agree with the findings and plan.  EKG Interpretation   None       Date: 08/19/2013  Rate: 85  Rhythm: normal sinus rhythm  QRS Axis: normal  Intervals: QT prolonged  ST/T Wave abnormalities: nonspecific ST/T changes  Conduction Disutrbances:none  Narrative Interpretation:   Old EKG Reviewed: unchanged   Pt with SOB, vascular congestion, signs of CHF.  Admitted by cardiology.  No ischemic changes on EKG  Rolan Bucco, MD 08/19/13 2000

## 2013-08-19 NOTE — Progress Notes (Signed)
ANTICOAGULATION CONSULT NOTE - Initial Consult  Pharmacy Consult for Heparin Indication: chest pain/ACS  No Known Allergies  Patient Measurements: Height: 5\' 1"  (154.9 cm) Weight: 212 lb (96.163 kg) IBW/kg (Calculated) : 47.8 Heparin Dosing Weight: ~71 kg  Vital Signs: Temp: 97.9 F (36.6 C) (11/08 1829) Temp src: Oral (11/08 1829) BP: 93/46 mmHg (11/08 2115) Pulse Rate: 86 (11/08 2115)  Labs:  Recent Labs  08/19/13 1440  HGB 9.1*  HCT 31.0*  PLT 85*  CREATININE 0.52    Estimated Creatinine Clearance: 87.3 ml/min (by C-G formula based on Cr of 0.52).   Medical History: Past Medical History  Diagnosis Date  . Hypertension   . Anxiety   . GERD (gastroesophageal reflux disease)   . Myocardial infarction     2001  . Hepatitis C   . Diabetes mellitus     type 2  . Hypoxia 08/19/2013  . Hypokalemia 08/19/2013    Medications:  Scheduled:  . ALPRAZolam  1.5 mg Oral TID  . [START ON 08/20/2013] aspirin EC  81 mg Oral Daily  . [START ON 08/20/2013] furosemide  40 mg Oral Q breakfast  . [START ON 08/20/2013] iron polysaccharides  150 mg Oral Daily  . metoprolol tartrate  12.5 mg Oral BID  . oxyCODONE  10 mg Oral TID  . [START ON 08/20/2013] pantoprazole  40 mg Oral Daily  . [START ON 08/20/2013] simvastatin  20 mg Oral Daily    Assessment: 52 yo female w/ hx of CAD, s/p MI admitted to [facility] for elevated troponins.  Hx CABG 2013.  Pt w/ Hgb 9.1, baseline Pltc 85.  Pharmacy asked to begin IV heparin for ACS.   Goal of Therapy:  Heparin level 0.3-0.7 units/ml Monitor platelets by anticoagulation protocol: Yes   Plan:  1. Start heparin bolus with 4000 units/hr. 2. Then start heparin gtt at 1000 units/hr. 3. Check heparin level 6 hrs after gtt starts.  Tad Moore, BCPS  Clinical Pharmacist Pager [phone]  08/19/2013 10:17 PM

## 2013-08-19 NOTE — ED Notes (Signed)
Pt placed on nasal cannula 2L 

## 2013-08-19 NOTE — ED Notes (Signed)
Pt now c/o chest pain 

## 2013-08-20 ENCOUNTER — Inpatient Hospital Stay (HOSPITAL_COMMUNITY): Payer: Medicaid Other

## 2013-08-20 DIAGNOSIS — F05 Delirium due to known physiological condition: Secondary | ICD-10-CM

## 2013-08-20 DIAGNOSIS — K7460 Unspecified cirrhosis of liver: Secondary | ICD-10-CM

## 2013-08-20 DIAGNOSIS — D696 Thrombocytopenia, unspecified: Secondary | ICD-10-CM | POA: Diagnosis present

## 2013-08-20 DIAGNOSIS — J449 Chronic obstructive pulmonary disease, unspecified: Secondary | ICD-10-CM

## 2013-08-20 LAB — URINALYSIS, ROUTINE W REFLEX MICROSCOPIC
Glucose, UA: NEGATIVE mg/dL
Ketones, ur: 15 mg/dL — AB
Leukocytes, UA: NEGATIVE
Nitrite: NEGATIVE
Protein, ur: NEGATIVE mg/dL
Specific Gravity, Urine: >1.046 — ABNORMAL HIGH (ref 1.005–1.030)
Urobilinogen, UA: 4 mg/dL — ABNORMAL HIGH (ref 0.0–1.0)

## 2013-08-20 LAB — CBC
HCT: 29 % — ABNORMAL LOW (ref 36.0–46.0)
HCT: 28.9 % — ABNORMAL LOW (ref 36.0–46.0)
Hemoglobin: 8.5 g/dL — ABNORMAL LOW (ref 12.0–15.0)
MCH: 24.9 pg — ABNORMAL LOW (ref 26.0–34.0)
MCHC: 29.3 g/dL — ABNORMAL LOW (ref 30.0–36.0)
MCV: 85 fL (ref 78.0–100.0)
MCV: 84.8 fL (ref 78.0–100.0)
Platelets: 77 10*3/uL — ABNORMAL LOW (ref 150–400)
Platelets: 77 10*3/uL — ABNORMAL LOW (ref 150–400)
RBC: 3.41 MIL/uL — ABNORMAL LOW (ref 3.87–5.11)
RBC: 3.41 MIL/uL — ABNORMAL LOW (ref 3.87–5.11)
RDW: 18.5 % — ABNORMAL HIGH (ref 11.5–15.5)
WBC: 8.3 10*3/uL (ref 4.0–10.5)
WBC: 8.5 10*3/uL (ref 4.0–10.5)

## 2013-08-20 LAB — BASIC METABOLIC PANEL
CO2: 40 mEq/L (ref 19–32)
Calcium: 8.3 mg/dL — ABNORMAL LOW (ref 8.4–10.5)
Chloride: 93 mEq/L — ABNORMAL LOW (ref 96–112)
GFR calc Af Amer: >90 mL/min (ref >90)
GFR calc non Af Amer: >90 mL/min (ref >90)
Glucose, Bld: 137 mg/dL — ABNORMAL HIGH (ref 70–99)
Potassium: 3.6 mEq/L (ref 3.5–5.1)
Sodium: 139 mEq/L (ref 135–145)

## 2013-08-20 LAB — AMMONIA: Ammonia: 160 umol/L — ABNORMAL HIGH (ref 11–60)

## 2013-08-20 LAB — LIPID PANEL
Cholesterol: 132 mg/dL (ref 0–200)
LDL Cholesterol: 66 mg/dL (ref 0–99)
Total CHOL/HDL Ratio: 2.6 RATIO
VLDL: 16 mg/dL (ref 0–40)

## 2013-08-20 LAB — HEPARIN LEVEL (UNFRACTIONATED)
Heparin Unfractionated: 0.27 IU/mL — ABNORMAL LOW (ref 0.30–0.70)
Heparin Unfractionated: 0.39 IU/mL (ref 0.30–0.70)

## 2013-08-20 LAB — D-DIMER, QUANTITATIVE (NOT AT ARMC): D-Dimer, Quant: 1.08 ug/mL-FEU — ABNORMAL HIGH (ref 0.00–0.48)

## 2013-08-20 LAB — HEPATIC FUNCTION PANEL
AST: 51 U/L — ABNORMAL HIGH (ref 0–37)
Albumin: 2.9 g/dL — ABNORMAL LOW (ref 3.5–5.2)
Indirect Bilirubin: 1.3 mg/dL — ABNORMAL HIGH (ref 0.3–0.9)
Total Protein: 6.7 g/dL (ref 6.0–8.3)

## 2013-08-20 LAB — HEMOGLOBIN A1C
Hgb A1c MFr Bld: 6.1 % — ABNORMAL HIGH (ref <5.7)
Mean Plasma Glucose: 128 mg/dL — ABNORMAL HIGH (ref <117)

## 2013-08-20 LAB — IRON AND TIBC
Iron: 25 ug/dL — ABNORMAL LOW (ref 42–135)
TIBC: 408 ug/dL (ref 250–470)
UIBC: 383 ug/dL (ref 125–400)

## 2013-08-20 LAB — T4, FREE: Free T4: 1.12 ng/dL (ref 0.80–1.80)

## 2013-08-20 LAB — TROPONIN I: Troponin I: <0.3 ng/mL (ref <0.30)

## 2013-08-20 MED ORDER — METOPROLOL TARTRATE 12.5 MG HALF TABLET
12.5000 mg | ORAL_TABLET | Freq: Two times a day (BID) | ORAL | Status: DC
  Administered 2013-08-20 – 2013-08-21 (×2): 12.5 mg via ORAL
  Filled 2013-08-20 (×4): qty 1

## 2013-08-20 MED ORDER — ALPRAZOLAM 0.25 MG PO TABS
0.2500 mg | ORAL_TABLET | Freq: Three times a day (TID) | ORAL | Status: DC | PRN
  Administered 2013-08-20: 14:00:00 0.25 mg via ORAL
  Filled 2013-08-20: qty 1

## 2013-08-20 MED ORDER — TIOTROPIUM BROMIDE MONOHYDRATE 18 MCG IN CAPS
18.0000 ug | ORAL_CAPSULE | Freq: Every day | RESPIRATORY_TRACT | Status: DC
  Administered 2013-08-21 – 2013-08-24 (×4): 18 ug via RESPIRATORY_TRACT
  Filled 2013-08-20 (×2): qty 5

## 2013-08-20 MED ORDER — OXYCODONE HCL 5 MG PO TABS: 5.0000 mg | ORAL_TABLET | Freq: Three times a day (TID) | ORAL | Status: DC

## 2013-08-20 MED ORDER — LACTULOSE 10 GM/15ML PO SOLN
30.0000 g | Freq: Three times a day (TID) | ORAL | Status: DC
  Administered 2013-08-20 – 2013-08-21 (×3): 30 g via ORAL
  Filled 2013-08-20 (×8): qty 45

## 2013-08-20 MED ORDER — MOMETASONE FURO-FORMOTEROL FUM 100-5 MCG/ACT IN AERO
2.0000 | INHALATION_SPRAY | Freq: Two times a day (BID) | RESPIRATORY_TRACT | Status: DC
  Administered 2013-08-20 – 2013-08-24 (×7): 2 via RESPIRATORY_TRACT
  Filled 2013-08-20 (×2): qty 8.8

## 2013-08-20 MED ORDER — RIFAXIMIN 200 MG PO TABS
200.0000 mg | ORAL_TABLET | Freq: Three times a day (TID) | ORAL | Status: DC
  Administered 2013-08-20 – 2013-08-22 (×6): 200 mg via ORAL
  Filled 2013-08-20 (×8): qty 1

## 2013-08-20 MED ORDER — IOHEXOL 350 MG/ML SOLN
80.0000 mL | Freq: Once | INTRAVENOUS | Status: AC | PRN
  Administered 2013-08-20: 16:00:00 80 mL via INTRAVENOUS

## 2013-08-20 MED ORDER — ALPRAZOLAM 0.5 MG PO TABS: 0.5000 mg | ORAL_TABLET | Freq: Three times a day (TID) | ORAL | Status: DC

## 2013-08-20 MED ORDER — OXYCODONE HCL 5 MG PO TABS: 5.0000 mg | ORAL_TABLET | Freq: Four times a day (QID) | ORAL | Status: DC | PRN

## 2013-08-20 NOTE — Consult Note (Signed)
[facility] Medical Consultation  Angy Swearengin ZOX:[id number] DOB: 1961/03/16 DOA: 08/19/2013 PCP: Runell Gess, MD   Requesting physician: Dr. Tresa Endo Date of consultation: 08/20/2013 Reason for consultation: confusion   Impression/Recommendations Principal Problem:   Confusion  Active Problems:   Anxiety disorder   CAD, ? Stent at [facility] in 2001, cath 03/02/12 with 95% Lt. Main   COPD (chronic obstructive pulmonary disease)   Obesity   S/P CABG x 2, 03/03/12 LIMA to LAD and VG to OM   SOB (shortness of breath)   Hypokalemia   Thrombocytopenia  52 y/o female with PMH of HTN, HPL, CAD s/p CABG, COPD/active smoker, liver cirrhosis, hep C presented with volume overload CHF, hypoxia, and confusion;  1. Acute on chronic CHF no recent echo (2013): LVEF 50%; management per cardiology  -monitor on diuresis with HCO2->>40  2. CAD s/p CABG; on BB, ASA, statin, cont management per cardiology;   3. Confusion likely related to liver cirrhosis; ammonia -160; currently on exam patient is not confused; neuro exam non focal;  -resume lactulose; add rifaximin; hold benzo/opioids; awaiting liver US/if significant ascites may need paracentesis; check UA r/o UTI; monitor SDU   4. Anemia/AoCD, thrombocytopenia likely related to underlying liver cirrhosis;  -check iron profile; although already on PO iron; monitor for bleeding on IV heparin   5. COPD/acute on chronic respiratory failure; active smoker; chronic CO2 retainer  -cont albuterol, oxygen, start advair, spiriva, Prn NiPPV; outpatient PFT; stop smoking  -elevated D dimer; pend CTA chest    I will followup again tomorrow. Please contact me if I can be of assistance in the meanwhile. Thank you for this consultation.  Chief Complaint: SOB   HPI: 52 y/o female with PMH of HTN, HPL, CAD s/p CABG, COPD/active smoker, liver cirrhosis, hep C presented with volume overload CHF, hypoxia, and confusion; she is being diuresed by cardiology,  hospital ist service is asked for evaluation of confusion  -she reports SOB, mild cough, LE edema; no nausea, vomiting or diarrhea; no chest pain, no palpitations;   Review of Systems:  Review of Systems  Constitutional: Negative for fever, chills and diaphoresis.  HENT: Positive for congestion. Negative for ear discharge and sore throat.   Eyes: Negative for blurred vision, double vision and photophobia.  Respiratory: Positive for cough, sputum production and shortness of breath. Negative for hemoptysis and wheezing.   Cardiovascular: Positive for orthopnea, leg swelling and PND. Negative for chest pain and palpitations.  Gastrointestinal: Positive for constipation. Negative for heartburn, nausea, abdominal pain, diarrhea and melena.  Genitourinary: Negative for dysuria.  Musculoskeletal: Positive for back pain. Negative for myalgias and neck pain.  Skin: Negative for rash.  Neurological: Positive for dizziness and weakness. Negative for tingling, tremors, sensory change, focal weakness, seizures, loss of consciousness and headaches.  Psychiatric/Behavioral: Negative for depression and suicidal ideas. The patient is nervous/anxious.      Past Medical History  Diagnosis Date  . Hypertension   . Anxiety   . GERD (gastroesophageal reflux disease)   . Myocardial infarction     2001  . Hepatitis C   . Diabetes mellitus     type 2  . Hypoxia 08/19/2013  . Hypokalemia 08/19/2013   Past Surgical History  Procedure Laterality Date  . Cardiac catheterization  03/02/12    99% left main stenosis distally, normal LV function  . Cholecystectomy    . Tubal ligation    . Coronary artery bypass graft  03/03/2012    Procedure: CORONARY ARTERY BYPASS  GRAFTING (CABG);  Surgeon: Kerin Perna, MD;  Location: [facility] OR;  Service: Open Heart Surgery;  Laterality: N/A;   Social History:  reports that she has been smoking Cigarettes.  She has a 60 pack-year smoking history. She does not have any smokeless  tobacco history on file. She reports that she does not drink alcohol or use illicit drugs.  No Known Allergies Family History  Problem Relation Age of Onset  . Coronary artery disease Father 40    MI/CABG  . Heart attack Father   . Stroke Father   . Stroke Mother     Prior to Admission medications   Medication Sig Start Date End Date Taking? Authorizing Provider  albuterol (PROVENTIL HFA;VENTOLIN HFA) 108 (90 BASE) MCG/ACT inhaler Inhale 2 puffs into the lungs every 6 (six) hours as needed for wheezing or shortness of breath.   Yes Historical Provider, MD  ALPRAZolam Prudy Feeler) 1 MG tablet Take 1.5 mg by mouth 3 (three) times daily.   Yes Historical Provider, MD  aspirin EC 81 MG tablet Take 81 mg by mouth daily.   Yes Historical Provider, MD  furosemide (LASIX) 40 MG tablet Take 1 tablet (40 mg total) by mouth daily with breakfast. 03/15/12  Yes Wayne E Gold, PA-C  meclizine (ANTIVERT) 12.5 MG tablet Take 12.5 mg by mouth 2 (two) times daily as needed for dizziness.   Yes Historical Provider, MD  metoprolol tartrate (LOPRESSOR) 25 MG tablet Take 0.5 tablets (12.5 mg total) by mouth 2 (two) times daily. 03/21/13  Yes Runell Gess, MD  ondansetron (ZOFRAN) 8 MG tablet Take 4-8 mg by mouth every 8 (eight) hours as needed for nausea or vomiting.   Yes Historical Provider, MD  Oxycodone HCl 10 MG TABS Take 10 mg by mouth 3 (three) times daily.   Yes Historical Provider, MD  oxymetazoline (AFRIN) 0.05 % nasal spray Place 1 spray into both nostrils 2 (two) times daily.   Yes Historical Provider, MD  pantoprazole (PROTONIX) 40 MG tablet Take 40 mg by mouth daily.   Yes Historical Provider, MD  simvastatin (ZOCOR) 20 MG tablet Take 20 mg by mouth daily.   Yes Historical Provider, MD  iron polysaccharides (NIFEREX) 150 MG capsule Take 1 capsule (150 mg total) by mouth daily. 03/11/12 03/11/13  Wilmon Pali, PA-C   Physical Exam: Blood pressure 102/57, pulse 92, temperature 97.6 F (36.4 C),  temperature source Oral, resp. rate 22, height 5\' 1"  (1.549 m), weight 94.983 kg (209 lb 6.4 oz), SpO2 96.00%. Filed Vitals:   08/20/13 1300  BP: 102/57  Pulse: 92  Temp:   Resp: 22     General:  Alert, oriented x 3  Eyes: eom-i perrla   ENT: no oral ulcers  Neck: supple  Cardiovascular: S1,S2 rrr  Respiratory: bl LL crackles 3  Abdomen: soft, distended, NT   Skin: no rash   Musculoskeletal: LE edema  Psychiatric: some hallucinations resolving   Neurologic: CN 2-12 intact, motor 5/5 BL symmetric   Labs on Admission:  Basic Metabolic Panel:  Recent Labs Lab 08/19/13 1440 08/19/13 2201 08/20/13 0500  NA 138  --  139  K 3.2*  --  3.6  CL 89*  --  93*  CO2 43*  --  40*  GLUCOSE 168*  --  137*  BUN 13  --  11  CREATININE 0.52  --  0.50  CALCIUM 8.6  --  8.3*  MG 1.8 2.0  --    Liver Function  Tests:  Recent Labs Lab 08/19/13 1440 08/20/13 0500  AST 43* 51*  ALT 23 22  ALKPHOS 131* 116  BILITOT 1.6* 1.7*  PROT 6.8 6.7  ALBUMIN 2.9* 2.9*   No results found for this basename: LIPASE, AMYLASE,  in the last 168 hours  Recent Labs Lab 08/20/13 1000  AMMONIA 160*   CBC:  Recent Labs Lab 08/19/13 1440 08/20/13 0530 08/20/13 1030  WBC 9.2 8.3 8.5  NEUTROABS 6.8  --   --   HGB 9.1* 8.5* 8.5*  HCT 31.0* 29.0* 28.9*  MCV 84.9 85.0 84.8  PLT 85* 77* 77*   Cardiac Enzymes:  Recent Labs Lab 08/19/13 2201 08/20/13 0530 08/20/13 1001  TROPONINI <0.30 <0.30 <0.30   BNP: No components found with this basename: POCBNP,  CBG: No results found for this basename: GLUCAP,  in the last 168 hours  Radiological Exams on Admission: Dg Chest Port 1 View  08/19/2013   CLINICAL DATA:  Shortness of breath  EXAM: PORTABLE CHEST - 1 VIEW  COMPARISON:  04/04/2012  FINDINGS: Status post CABG. Mild cardiac enlargement. Mild to moderate vascular congestion. No definite pulmonary edema currently.  IMPRESSION: Cardiomegaly with vascular congestion.    Electronically Signed   By: Esperanza Heir M.D.   On: 08/19/2013 15:32    EKG: Independently reviewed. NSR,   Time spent: > 45 minutes   Esperanza Sheets [facility] Pager [phone]  If 7PM-7AM, please contact night-coverage [email or web address] Password [facility] 08/20/2013, 3:33 PM

## 2013-08-20 NOTE — Progress Notes (Signed)
PHARMACY FOLLOW UP NOTE   Pharmacy Consult for : Heparin Indication: chest pain/ACS  Dosing Weight: 71 kg  Labs:  Recent Labs  08/19/13 1440 08/19/13 2201 08/20/13 0500 08/20/13 0530 08/20/13 1030  HGB 9.1*  --   --  8.5* 8.5*  HCT 31.0*  --   --  29.0* 28.9*  PLT 85*  --   --  77* 77*  APTT  --  29  --   --   --   LABPROT  --  14.8  --   --   --   INR  --  1.19  --   --   --   HEPARINUNFRC  --   --   --  0.27* 0.39  CREATININE 0.52  --  0.50  --   --    Estimated Creatinine Clearance: 86.6 ml/min (by C-G formula based on Cr of 0.5). Lab Results   TROPONINI <0.30 08/20/2013    Pertinent Medications:  Scheduled:  . ALPRAZolam  0.5 mg Oral TID  . antiseptic oral rinse  15 mL Mouth Rinse BID  . aspirin EC  81 mg Oral Daily  . influenza vac split quadrivalent PF  0.5 mL Intramuscular Tomorrow-1000  . iron polysaccharides  150 mg Oral Daily  . metoprolol tartrate  12.5 mg Oral BID  . oxyCODONE  5 mg Oral TID  . pantoprazole  40 mg Oral Daily  . simvastatin  20 mg Oral Daily   Infusions:  . sodium chloride 10 mL/hr (08/19/13 2303)  . heparin 1,100 Units/hr (08/20/13 1610)   PRN: ondansetron (ZOFRAN) IV  Assessment:  52 y/o female on Heparin infusion for chest pain/ACS.  Troponin < 0.3.  Baseline Platelets < 100 k.    Heparin rate 1100 units/hr.  Heparin level therapeutic 0.39 units/ml.  No bleeding complications noted .  Platelets stable 77 K.  EKG noted with prolonged QTc 525 and patient receiving Zofran prn [doses given].  Goal:  Heparin level 0.3-0.7 units/ml  Monitor platelets while on Heparin protocol.   Plan: 1. Continue Heparin at 1100 units/hr. 2. Repeat Heparin level, Platelets in 8 hours.   DRUG ALERT  Noted on EKG that patient has a prolonged QTc.  Zofran known to prolong QTc and should be used with caution. RECOMMEND:  Changing Zofran to other agent for treatment of N/V symptoms.  Chrystal Zeimet, Deetta Perla.D 08/20/2013, 12:52 PM

## 2013-08-20 NOTE — Progress Notes (Signed)
More information now that sister is here. Subjective: Lethargic, now with nausea and vomiting   Objective: Vital signs in last 24 hours: Temp:  [97.6 F (36.4 C)-98.6 F (37 C)] 97.6 F (36.4 C) (11/09 0426) Pulse Rate:  [81-96] 88 (11/09 1100) Resp:  [15-24] 22 (11/09 1100) BP: (87-114)/(31-60) 98/54 mmHg (11/09 1100) SpO2:  [75 %-100 %] 96 % (11/09 1100) Weight:  [209 lb 6.4 oz (94.983 kg)-212 lb (96.163 kg)] 209 lb 6.4 oz (94.983 kg) (11/08 2222) Weight change:  Last BM Date: 08/18/13 Intake/Output from previous day: -795 11/08 0701 - 11/09 0700 In: -  Out: 795 [Urine:795] Intake/Output this shift: Total I/O In: 240 [P.O.:240] Out: -   PE: General:Pleasant affect but anxious and lethargic, NAD Skin:Warm and dry, brisk capillary refill HEENT:normocephalic, sclera clear, mucus membranes moist Neck:supple, no JVD, no bruits  Heart:S1S2 RRR without murmur, gallup, rub or click Lungs:clear without rales, rhonchi, or wheezes JYN:WGNF, non tender, + BS, do not palpate liver spleen or masses Ext:no lower ext edema, 2+ pedal pulses, 2+ radial pulses Neuro:alert and oriented, MAE, follows commands, + facial symmetry   Lab Results:  Recent Labs  08/20/13 0530 08/20/13 1030  WBC 8.3 8.5  HGB 8.5* 8.5*  HCT 29.0* 28.9*  PLT 77* 77*   BMET  Recent Labs  08/19/13 1440 08/20/13 0500  NA 138 139  K 3.2* 3.6  CL 89* 93*  CO2 43* 40*  GLUCOSE 168* 137*  BUN 13 11  CREATININE 0.52 0.50  CALCIUM 8.6 8.3*    Recent Labs  08/20/13 0530 08/20/13 1001  TROPONINI <0.30 <0.30    Lab Results  Component Value Date   CHOL 132 08/20/2013   HDL 50 08/20/2013   LDLCALC 66 08/20/2013   TRIG 78 08/20/2013   CHOLHDL 2.6 08/20/2013   Lab Results  Component Value Date   HGBA1C 6.1* 08/19/2013     Lab Results  Component Value Date   TSH 2.077 08/19/2013    Hepatic Function Panel  Recent Labs  08/20/13 0500  PROT 6.7  ALBUMIN 2.9*  AST 51*  ALT 22    ALKPHOS 116  BILITOT 1.7*  BILIDIR 0.4*  IBILI 1.3*    Recent Labs  08/20/13 0500  CHOL 132   No results found for this basename: PROTIME,  in the last 72 hours  .ddi    Studies/Results: Dg Chest Port 1 View  08/19/2013   CLINICAL DATA:  Shortness of breath  EXAM: PORTABLE CHEST - 1 VIEW  COMPARISON:  04/04/2012  FINDINGS: Status post CABG. Mild cardiac enlargement. Mild to moderate vascular congestion. No definite pulmonary edema currently.  IMPRESSION: Cardiomegaly with vascular congestion.   Electronically Signed   By: Esperanza Heir M.D.   On: 08/19/2013 15:32    Medications: I have reviewed the patient's current medications. Scheduled Meds: . ALPRAZolam  0.5 mg Oral TID  . antiseptic oral rinse  15 mL Mouth Rinse BID  . aspirin EC  81 mg Oral Daily  . influenza vac split quadrivalent PF  0.5 mL Intramuscular Tomorrow-1000  . iron polysaccharides  150 mg Oral Daily  . metoprolol tartrate  12.5 mg Oral BID  . oxyCODONE  5 mg Oral TID  . pantoprazole  40 mg Oral Daily  . simvastatin  20 mg Oral Daily   Continuous Infusions: . sodium chloride 10 mL/hr (08/19/13 2303)  . heparin 1,100 Units/hr (08/20/13 0620)   PRN Meds:.acetaminophen, albuterol, nitroGLYCERIN, ondansetron (ZOFRAN)  IV  Assessment/Plan: Principal Problem:   Hypoxia Active Problems:   CAD, ? Stent at [facility] in 2001, cath 03/02/12 with 95% Lt. Main   S/P CABG x 2, 03/03/12 LIMA to LAD and VG to OM   Anxiety disorder   COPD (chronic obstructive pulmonary disease)   Obesity   SOB (shortness of breath)   Hypokalemia   Thrombocytopenia  PLAN:  Continues to be lethargic though last pm on admit was not.  Ammonia level 160, will begin lactulose.  DDimer elevated will add CT angio of her chest, also abd ultrasound.   Plts low and were on admit, also more anemic on heparin.  ? Cirrhosis Hx hepatitis C  HGB A1C mildly elevated at 6.1 will adjust diet add hold SSI unles glucose much higher I have decreased  pain meds and xanax  Will  transfer to stepdown due to her lethargy and concern for hypoxia.  TRH consult vs. GI .   Sister tells me pt was not taking xanax and pain med as ordered but much less.  I have decreased significantly without completely stopping but placed prn  LOS: 1 day   Time spent with pt. :20 minutes. [facility] R  Nurse Practitioner Certified Pager [phone] 08/20/2013, 1:18 PM   Patient seen and examined. Agree with assessment and plan. Pt is very anxious. Mild elevation of d-dimer. For CT chest CT and abd [place]. Agee with transfer to stepdown for closer observaton.   Lennette Bihari, MD, [facility] 08/20/2013 2:09 PM

## 2013-08-20 NOTE — Significant Event (Addendum)
Victoria Bell called informed of pt stratus b/p 95/49, hr 93, 94% o2 sat on 3.5 lit . CO2  Crit lab at 40. Pt very sleepy.

## 2013-08-20 NOTE — Significant Event (Signed)
Report given to [facility] RN Tim . Pt transferred to [facility] via bed with O2 AT 3.5 N/C ,monitor NSR , VSS 102/57 , 92 HR, R 22. Pt belongings sent with pt.

## 2013-08-20 NOTE — Progress Notes (Addendum)
ANTICOAGULATION CONSULT NOTE - Follow Up Consult  Pharmacy Consult for heparin Indication: chest pain/ACS  Labs:  Recent Labs  08/19/13 1440 08/19/13 2201 08/20/13 0530  HGB 9.1*  --  8.5*  HCT 31.0*  --  29.0*  PLT 85*  --  77*  APTT  --  29  --   LABPROT  --  14.8  --   INR  --  1.19  --   HEPARINUNFRC  --   --  0.27*  CREATININE 0.52  --   --   TROPONINI  --  <0.30  --     Assessment: 52yo female slightly subtherapeutic on heparin with initial dosing for CP; cards notified earlier of low plt, now slightly lower, RN reports no signs of bleeding.  Goal of Therapy:  Heparin level 0.3-0.7 units/ml   Plan:  Will increase heparin gtt by 1 unit/kg/hr to 1100 units/hr and check level and CBC in 6hr.  Vernard Gambles, PharmD, BCPS  08/20/2013,6:13 AM

## 2013-08-21 ENCOUNTER — Inpatient Hospital Stay (HOSPITAL_COMMUNITY): Payer: Medicaid Other

## 2013-08-21 DIAGNOSIS — Z951 Presence of aortocoronary bypass graft: Secondary | ICD-10-CM

## 2013-08-21 DIAGNOSIS — D696 Thrombocytopenia, unspecified: Secondary | ICD-10-CM

## 2013-08-21 LAB — CBC
HCT: 27.9 % — ABNORMAL LOW (ref 36.0–46.0)
Hemoglobin: 8.1 g/dL — ABNORMAL LOW (ref 12.0–15.0)
MCH: 24.7 pg — ABNORMAL LOW (ref 26.0–34.0)
MCV: 85.1 fL (ref 78.0–100.0)
RBC: 3.28 MIL/uL — ABNORMAL LOW (ref 3.87–5.11)

## 2013-08-21 LAB — BASIC METABOLIC PANEL
BUN: 10 mg/dL (ref 6–23)
CO2: 42 mEq/L (ref 19–32)
Calcium: 8.6 mg/dL (ref 8.4–10.5)
Creatinine, Ser: 0.51 mg/dL (ref 0.50–1.10)
Glucose, Bld: 95 mg/dL (ref 70–99)
Potassium: 3.5 mEq/L (ref 3.5–5.1)

## 2013-08-21 LAB — HEPATIC FUNCTION PANEL
Alkaline Phosphatase: 106 U/L (ref 39–117)
Bilirubin, Direct: 0.6 mg/dL — ABNORMAL HIGH (ref 0.0–0.3)
Indirect Bilirubin: 1.2 mg/dL — ABNORMAL HIGH (ref 0.3–0.9)
Total Bilirubin: 1.8 mg/dL — ABNORMAL HIGH (ref 0.3–1.2)

## 2013-08-21 LAB — AMMONIA: Ammonia: 108 umol/L — ABNORMAL HIGH (ref 11–60)

## 2013-08-21 MED ORDER — LACTULOSE 10 GM/15ML PO SOLN
30.0000 g | Freq: Two times a day (BID) | ORAL | Status: DC
  Administered 2013-08-21 – 2013-08-23 (×4): 30 g via ORAL
  Filled 2013-08-21 (×6): qty 45

## 2013-08-21 MED ORDER — NADOLOL 20 MG PO TABS
20.0000 mg | ORAL_TABLET | Freq: Two times a day (BID) | ORAL | Status: DC
  Administered 2013-08-21 – 2013-08-24 (×6): 20 mg via ORAL
  Filled 2013-08-21 (×9): qty 1

## 2013-08-21 MED ORDER — POTASSIUM CHLORIDE CRYS ER 20 MEQ PO TBCR
20.0000 meq | EXTENDED_RELEASE_TABLET | Freq: Once | ORAL | Status: AC
  Administered 2013-08-21: 20 meq via ORAL
  Filled 2013-08-21: qty 1

## 2013-08-21 MED ORDER — SPIRONOLACTONE 25 MG PO TABS
25.0000 mg | ORAL_TABLET | Freq: Every day | ORAL | Status: DC
  Administered 2013-08-21: 25 mg via ORAL
  Filled 2013-08-21 (×2): qty 1

## 2013-08-21 MED ORDER — ALPRAZOLAM 0.25 MG PO TABS
0.2500 mg | ORAL_TABLET | Freq: Once | ORAL | Status: AC
  Administered 2013-08-22: 0.25 mg via ORAL
  Filled 2013-08-21: qty 1

## 2013-08-21 NOTE — Progress Notes (Addendum)
Subjective: No complaints. Breathing better, but still using [place] at 2 L.   Objective: Vital signs in last 24 hours: Temp:  [97.9 F (36.6 C)-98.6 F (37 C)] 97.9 F (36.6 C) (11/10 0822) Pulse Rate:  [70-96] 75 (11/10 0822) Resp:  [13-22] 15 (11/10 0822) BP: (98-141)/(42-60) 133/42 mmHg (11/10 0800) SpO2:  [95 %-100 %] 100 % (11/10 0822) Weight:  [192 lb 3.9 oz (87.2 kg)] 192 lb 3.9 oz (87.2 kg) (11/10 0500) Last BM Date: 08/20/13  Intake/Output from previous day: 11/09 0701 - 11/10 0700 In: 736.8 [P.O.:240; I.V.:496.8] Out: 1120 [Urine:900; Stool:220] Intake/Output this shift:    Medications Current Facility-Administered Medications  Medication Dose Route Frequency Provider Last Rate Last Dose  . 0.9 %  sodium chloride infusion   Intravenous Continuous Nada Boozer, NP 20 mL/hr at 08/21/13 0700    . acetaminophen (TYLENOL) tablet 650 mg  650 mg Oral Q4H PRN Nada Boozer, NP   650 mg at 08/20/13 0500  . albuterol (PROVENTIL HFA;VENTOLIN HFA) 108 (90 BASE) MCG/ACT inhaler 2 puff  2 puff Inhalation Q6H PRN Nada Boozer, NP      . antiseptic oral rinse (BIOTENE) solution 15 mL  15 mL Mouth Rinse BID Lennette Bihari, MD   15 mL at 08/20/13 0800  . aspirin EC tablet 81 mg  81 mg Oral Daily Nada Boozer, NP   81 mg at 08/20/13 1134  . influenza vac split quadrivalent PF (FLUARIX) injection 0.5 mL  0.5 mL Intramuscular Tomorrow-1000 Lennette Bihari, MD      . iron polysaccharides (NIFEREX) capsule 150 mg  150 mg Oral Daily Nada Boozer, NP   150 mg at 08/20/13 1138  . lactulose (CHRONULAC) 10 GM/15ML solution 30 g  30 g Oral TID PC & HS Nada Boozer, NP   30 g at 08/20/13 2313  . metoprolol tartrate (LOPRESSOR) tablet 12.5 mg  12.5 mg Oral BID Nada Boozer, NP   12.5 mg at 08/20/13 2312  . mometasone-formoterol (DULERA) 100-5 MCG/ACT inhaler 2 puff  2 puff Inhalation BID Esperanza Sheets, MD   2 puff at 08/20/13 2245  . nitroGLYCERIN (NITROSTAT) SL tablet 0.4 mg  0.4 mg Sublingual Q5  Min x 3 PRN Nada Boozer, NP      . ondansetron [facility]) injection 4 mg  4 mg Intravenous Q6H PRN Nada Boozer, NP   4 mg at 08/20/13 1131  . pantoprazole (PROTONIX) EC tablet 40 mg  40 mg Oral Daily Nada Boozer, NP   40 mg at 08/20/13 1000  . rifaximin (XIFAXAN) tablet 200 mg  200 mg Oral TID Esperanza Sheets, MD   200 mg at 08/20/13 2312  . simvastatin (ZOCOR) tablet 20 mg  20 mg Oral Daily Nada Boozer, NP   20 mg at 08/20/13 1000  . tiotropium (SPIRIVA) inhalation capsule 18 mcg  18 mcg Inhalation Daily Esperanza Sheets, MD        PE: General appearance: alert, cooperative and no distress Lungs: clear to auscultation bilaterally Heart: regular rate and rhythm Extremities: no LEE Pulses: 2+ and symmetric Skin: warm and dry Neurologic: Grossly normal  Lab Results:   Recent Labs  08/20/13 0530 08/20/13 1030 08/21/13 0510  WBC 8.3 8.5 6.4  HGB 8.5* 8.5* 8.1*  HCT 29.0* 28.9* 27.9*  PLT 77* 77* 69*   BMET  Recent Labs  08/19/13 1440 08/20/13 0500 08/21/13 0510  NA 138 139 139  K 3.2* 3.6 3.5  CL 89* 93* 94*  CO2 43* 40* 42*  GLUCOSE 168* 137* 95  BUN 13 11 10   CREATININE 0.52 0.50 0.51  CALCIUM 8.6 8.3* 8.6   PT/INR  Recent Labs  08/19/13 2201  LABPROT 14.8  INR 1.19   Cholesterol  Recent Labs  08/20/13 0500  CHOL 132   Cardiac Panel (last 3 results)  Recent Labs  08/19/13 2201 08/20/13 0530 08/20/13 1001  TROPONINI <0.30 <0.30 <0.30    Studies/Results:  CT of chest/ abdomin  IMPRESSION: No evidence of pulmonary embolism.  Mild emphysematous changes.  Cirrhosis with suspected splenomegaly.  Abdominal US IMPRESSION: 1. No acute abnormality seen within the abdomen. 2. Diffuse fatty infiltration within the liver; liver difficult to fully assess due to fatty infiltration. 3. Splenomegaly noted. 4. Status post cholecystectomy.   Assessment/Plan  Principal Problem:   Hypoxia Active Problems:   Anxiety disorder   CAD, ? Stent at  [facility] in 2001, cath 03/02/12 with 95% Lt. Main   COPD (chronic obstructive pulmonary disease)   Obesity   S/P CABG x 2, 03/03/12 LIMA to LAD and VG to OM   SOB (shortness of breath)   Hypokalemia   Thrombocytopenia  Plan: Troponins negative x 3. ? NST. Pt breathing better but still using [place] at 2L. O2 sats 95%. ? OP sleep study. Ct negative for PE. Suspicion for cirrhosis and splenomegaly. Abdominal ultrasound demonstrated diffuse fatty infiltration within the lever; study was limited. TRH following. Ammonia levels improved but still elevated at 108. Probably needs more lactulose. TRH managing.  MD to follow.     LOS: 2 days    Brittainy M. Sharol Harness, PA-C 08/21/2013 8:52 AM  I have seen and examined the patient along with Brittainy M. Sharol Harness, PA-C.  I have reviewed the chart, notes and new data.  I agree with PA's note.  Key new complaints:  awake, alert and coherent; She is having multiple watery stools her fiancee died of complications of Hep C 2 years ago; they were together for 7 years before that. The patient's PCP, Dr. Leatha Gilding told Mrs. Michelle that she also has cirrhosis, years ago.  Key examination changes: no jaundice, edema improving, alert and oriented Key new findings / data: CT results noted, ammonia a little better; labs are consistent with iron deficiency anemia, but also with evidence of hypersplenism (thrombocytopenia) BNP is elevated, but much lower than last year  PLAN: She has evidence of advanced chronic liver disease, probably cirrhosis, with evidence of parenchymal failure (encephalopathy, elevated bili, mildly abnormal coags) and hemodynamic complications (splenomegaly). Also has Fe deficiency of uncertain cause. Reduce lactulose frequency to BID. Add spironolactone. 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 Discontinue statin - review lipids in 3 months. Needs referral to a hepatology specialist - she prefers [facility]. Will try to make those arrangements  for her as she will need complete staging evaluation and see if she is still a good candidate for antiviral therapy, has esophageal varices, etc. Suspect her edema is entirely due to liver problems, no signs of left heart failure.  Thurmon Fair, MD, [facility] [phone] 08/21/2013, 11:14 AM

## 2013-08-21 NOTE — Care Management Note (Signed)
    Page 1 of 1   08/21/2013     11:55:12 AM   CARE MANAGEMENT NOTE 08/21/2013  Patient:  Victoria Bell, Victoria Bell   Account Number:  [id number]  Date Initiated:  08/21/2013  Documentation initiated by:  Junius Creamer  Subjective/Objective Assessment:   adm w hypoxia     Action/Plan:   lives w sister, pcp dr j berry   Anticipated DC Date:     Anticipated DC Plan:           Choice offered to / List presented to:             Status of service:   Medicare Important Message given?   (If response is "NO", the following Medicare IM given date fields will be blank) Date Medicare IM given:   Date Additional Medicare IM given:    Discharge Disposition:    Per UR Regulation:  Reviewed for med. necessity/level of care/duration of stay  If discussed at Long Length of Stay Meetings, dates discussed:    Comments:  11/10 1154a debbie Atavia Poppe rn,bsn pt has hx of hhc w [facility].

## 2013-08-21 NOTE — Consult Note (Signed)
[facility] CONSULT F/U Note [place] TEAM 1 - Stepdown/ICU TEAM   Victoria Bell BJY:[id number] DOB: [birth year] DOA: 08/19/2013 PCP: Runell Gess, MD  Reason for Consult / Brief Narrative: 52 y/o female with PMH of HTN, HPL, CAD s/p CABG, COPD/active smoker, liver cirrhosis, Hep C who presented with volume overload due to CHF, with hypoxia and confusion who was admitted and is being diuresed by Cardiology.  The Hospitalist service was asked to consult for evaluation of her confusion.  Recommendations:  Hepatic encephalopathy Initial ammonia 160 - cont lactulose and now rifaximin - MS improved, but remains subtly confused and somewhat slow in response to questioning   Hepatic steatosis / cirrhosis with mild coagulopathy  UNOS MELD score 10 / Mayo MELD 4 -Tbil and INR were both normal May 2013 - will check hepatitis panel and Hep C viral load - denies ever having been told she had significant liver disease, or ever having been tx for Hepatitis C - vehemently denies any hx of EtOH use   COPD/acute on chronic respiratory failure Well compensated at this time   Anemia/AoCD Recheck CBC in AM - no evidence of acute blood loss  Thrombocytopenia  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  Her speech is somewhat slow and she appears to be very mildly confused when questioned in great detail.  She denies chest pain shortness of breath fevers chills nausea or  vomiting.  Objective: Blood pressure 133/42, pulse 75, temperature 97.9 F (36.6 C), temperature source Oral, resp. rate 15, height 5\' 1"  (1.549 m), weight 87.2 kg (192 lb 3.9 oz), SpO2 97.00%.  Intake/Output Summary (Last 24 hours) at 08/21/13 1133 Last data filed at 08/21/13 0900  Gross per 24 hour  Intake 536.83 ml  Output    720 ml  Net -183.17 ml   Exam: General: No acute respiratory distress Lungs: Poor air movement in bilateral bases - no wheeze Cardiovascular: Regular rate and rhythm without murmur gallop or rub normal S1 and S2 Abdomen: Nontender, modestly protuberant/distended, soft, bowel sounds positive, no rebound, no appreciable mass Extremities: No significant cyanosis, clubbing, or edema bilateral lower extremities  Data Reviewed: Basic Metabolic Panel:  Recent Labs Lab 08/19/13 1440 08/19/13 2201 08/20/13 0500 08/21/13 0510  NA 138  --  139 139  K 3.2*  --  3.6 3.5  CL 89*  --  93* 94*  CO2 43*  --  40* 42*  GLUCOSE 168*  --  137* 95  BUN 13  --  11 10  CREATININE 0.52  --  0.50 0.51  CALCIUM 8.6  --  8.3* 8.6  MG 1.8 2.0  --   --    Liver Function Tests:  Recent Labs Lab 08/19/13 1440 08/20/13 0500 08/21/13 0510  AST 43* 51* 39*  ALT 23 22 19   ALKPHOS 131* 116 106  BILITOT 1.6* 1.7* 1.8*  PROT 6.8 6.7 6.4  ALBUMIN 2.9* 2.9* 2.7*   Recent Labs Lab 08/20/13  1000 08/21/13 0510  AMMONIA 160* 108*   CBC:  Recent Labs Lab 08/19/13 1440 08/20/13 0530 08/20/13 1030 08/21/13 0510  WBC 9.2 8.3 8.5 6.4  NEUTROABS 6.8  --   --   --   HGB 9.1* 8.5* 8.5* 8.1*  HCT 31.0* 29.0* 28.9* 27.9*  MCV 84.9 85.0 84.8 85.1  PLT 85* 77* 77* 69*   Cardiac Enzymes:  Recent Labs Lab 08/19/13 2201 08/20/13 0530 08/20/13 1001  TROPONINI <0.30 <0.30 <0.30   BNP (last 3 results)  Recent Labs  08/19/13 1440  PROBNP 650.2*     Recent Results (from the past 240 hour(s))  MRSA PCR SCREENING     Status: None   Collection Time    08/20/13   4:30 PM      Result Value Range Status   MRSA by PCR NEGATIVE  NEGATIVE Final   Comment:            The GeneXpert MRSA Assay (FDA     approved for NASAL specimens     only), is one component of a     comprehensive MRSA colonization     surveillance program. It is not     intended to diagnose MRSA     infection nor to guide or     monitor treatment for     MRSA infections.     Studies:  Recent x-ray studies have been reviewed in detail by the Attending Physician  Scheduled Meds:  Scheduled Meds: . antiseptic oral rinse  15 mL Mouth Rinse BID  . aspirin EC  81 mg Oral Daily  . influenza vac split quadrivalent PF  0.5 mL Intramuscular Tomorrow-1000  . iron polysaccharides  150 mg Oral Daily  . lactulose  30 g Oral TID PC & HS  . mometasone-formoterol  2 puff Inhalation BID  . nadolol  20 mg Oral BID  . pantoprazole  40 mg Oral Daily  . rifaximin  200 mg Oral TID  . spironolactone  25 mg Oral Daily  . tiotropium  18 mcg Inhalation Daily    Time spent on care of this patient: 35 mins   [facility]  [facility] Office  [phone] Pager - Text Page per Loretha Stapler as per below:  On-Call/Text Page:      [email or web address]      password [email or web address]  If 7PM-7AM, please contact night-coverage [email or web address] Password [email or web address] 08/21/2013, 11:33 AM   LOS: 2 days

## 2013-08-22 DIAGNOSIS — E669 Obesity, unspecified: Secondary | ICD-10-CM

## 2013-08-22 LAB — COMPREHENSIVE METABOLIC PANEL
ALT: 17 U/L (ref 0–35)
AST: 34 U/L (ref 0–37)
Albumin: 2.7 g/dL — ABNORMAL LOW (ref 3.5–5.2)
Alkaline Phosphatase: 99 U/L (ref 39–117)
CO2: 39 mEq/L — ABNORMAL HIGH (ref 19–32)
Calcium: 8.8 mg/dL (ref 8.4–10.5)
GFR calc Af Amer: >90 mL/min (ref >90)
GFR calc non Af Amer: >90 mL/min (ref >90)
Glucose, Bld: 91 mg/dL (ref 70–99)
Sodium: 139 mEq/L (ref 135–145)

## 2013-08-22 LAB — CBC
Hemoglobin: 8 g/dL — ABNORMAL LOW (ref 12.0–15.0)
MCH: 24.4 pg — ABNORMAL LOW (ref 26.0–34.0)
MCHC: 29.2 g/dL — ABNORMAL LOW (ref 30.0–36.0)
RBC: 3.28 MIL/uL — ABNORMAL LOW (ref 3.87–5.11)
RDW: 18.4 % — ABNORMAL HIGH (ref 11.5–15.5)

## 2013-08-22 LAB — HEPATITIS PANEL, ACUTE
HCV Ab: NEGATIVE
Hep B C IgM: NONREACTIVE

## 2013-08-22 LAB — AMMONIA: Ammonia: 80 umol/L — ABNORMAL HIGH (ref 11–60)

## 2013-08-22 MED ORDER — RIFAXIMIN 550 MG PO TABS
550.0000 mg | ORAL_TABLET | Freq: Two times a day (BID) | ORAL | Status: DC
  Administered 2013-08-22 – 2013-08-24 (×4): 550 mg via ORAL
  Filled 2013-08-22 (×5): qty 1

## 2013-08-22 MED ORDER — ALPRAZOLAM 0.5 MG PO TABS
0.5000 mg | ORAL_TABLET | Freq: Three times a day (TID) | ORAL | Status: DC | PRN
  Administered 2013-08-23 – 2013-08-24 (×3): 0.5 mg via ORAL
  Filled 2013-08-22 (×4): qty 1

## 2013-08-22 MED ORDER — SODIUM CHLORIDE 0.9 % IV BOLUS (SEPSIS)
250.0000 mL | Freq: Once | INTRAVENOUS | Status: AC
  Administered 2013-08-22: 250 mL via INTRAVENOUS

## 2013-08-22 MED ORDER — ALUM & MAG HYDROXIDE-SIMETH 200-200-20 MG/5ML PO SUSP: 15.0000 mL | ORAL | Status: DC | PRN

## 2013-08-22 MED ORDER — SPIRONOLACTONE 12.5 MG HALF TABLET
12.5000 mg | ORAL_TABLET | Freq: Every day | ORAL | Status: DC
  Administered 2013-08-22 – 2013-08-23 (×2): 12.5 mg via ORAL
  Filled 2013-08-22 (×3): qty 1

## 2013-08-22 NOTE — Progress Notes (Addendum)
[facility] Progress Note [place] TEAM 1 - Stepdown/ICU TEAM   Cynai Skeens ZOX:[id number] DOB: 1961-07-30 DOA: 08/19/2013 PCP: Runell Gess, MD  Brief narrative: 52 y/o female admitted on 11/8 to the cardiology service for hypoxia and acute pulmonary edema. She was started on Diuretics and has serial troponins to evaluate for ACS. These were negative and it appears that the fluid overload was being managed appropriately. However, she became confused and therefore a medicine consult was requested. She was found to have an elevated ammonia level signifying hepatic encephalopath and treated with Lactulose and Xifaxin. Prior to today, the patient was not aware of having cirrhosis of the liver.   Assessment/Plan: Principal Problem:   Hypoxia - underlying COPD? - possible that it was due to pulm edema however, last ECHO in 2013 did not show and systolic or diastolic CHF - currently resolved - cardiology has signed off for now as not further cardiac work up needed - Aldactone has been decreased by Cardio to 12.5 - not sure why pt on Lasix at home- will need repeat ECHO to evaluate further  Active Problems:  Cirrhosis of the liver- Child Pugh B - due to fatty liver- hepatitis serologies negative - will check ANA as well.  - cont Xifaxin and Lactulose for encephalopathy - cont Nadalol for possible varices - no ascites noted on Ultrasound or on CT Chest (upper abdomen) - check AFP    Anxiety disorder - sever anxiety after hearing that she has cirrhosis although I did explain that it can be medically managed - have advised limited use of Xanax (she usually only takes one 1mg  tab daily at home) - have ordered 0.5 mg Xanax PRN for now    CAD, ? St[facility] at [facility] in [facility], cath 03/02/12 with 95% Lt. Main   S/P CABG x 2, 03/03/12 [facility] to LAD and VG to OM - out pt f/u    COPD? 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    Objective: Blood pressure 92/52, pulse 18, temperature 97.8 F (36.6 C), temperature source Oral, resp. rate 19, height 5\' 1"  (1.549 m), weight 86.7 kg (191 lb 2.2 oz), SpO2 98.00%.  Intake/Output Summary (Last 24 hours) at 08/22/13 1755 Last data filed at 08/22/13 1731  Gross per 24 hour  Intake   1580 ml  Output    150 ml  Net   1430 ml     Exam: General: No acute respiratory distress, morbidly obese Lungs: Clear to auscultation bilaterally without wheezes or crackles Cardiovascular: Regular rate and rhythm without murmur gallop or rub normal S1 and S2 Abdomen: Nontender, nondistended, soft, bowel sounds  positive, no rebound, no ascites, no appreciable mass Extremities: No significant cyanosis, clubbing, or edema bilateral lower extremities  Data Reviewed: Basic Metabolic Panel:  Recent Labs Lab 08/19/13 1440 08/19/13 2201 08/20/13 0500 08/21/13 0510 08/22/13 0840  NA 138  --  139 139 139  K 3.2*  --  3.6 3.5 3.3*  CL 89*  --  93* 94* 96  CO2 43*  --  40* 42* 39*  GLUCOSE 168*  --  137* 95 91  BUN 13  --  11 10 9   CREATININE 0.52  --  0.50 0.51 0.56  CALCIUM 8.6  --  8.3* 8.6 8.8  MG 1.8 2.0  --   --    --    Liver Function Tests:  Recent Labs Lab 08/19/13 1440 08/20/13 0500 08/21/13 0510 08/22/13 0840  AST 43* 51* 39* 34  ALT 23 22 19 17   ALKPHOS 131* 116 106 99  BILITOT 1.6* 1.7* 1.8* 1.7*  PROT 6.8 6.7 6.4 6.3  ALBUMIN 2.9* 2.9* 2.7* 2.7*   No results found for this basename: LIPASE, AMYLASE,  in the last 168 hours  Recent Labs Lab 08/20/13 1000 08/21/13 0510 08/22/13 0450  AMMONIA 160* 108* 80*   CBC:  Recent Labs Lab 08/19/13 1440 08/20/13 0530 08/20/13 1030 08/21/13 0510 08/22/13 0840  WBC 9.2 8.3 8.5 6.4 7.1  NEUTROABS 6.8  --   --   --   --   HGB 9.1* 8.5* 8.5* 8.1* 8.0*  HCT 31.0* 29.0* 28.9* 27.9* 27.4*  MCV 84.9 85.0 84.8 85.1 83.5  PLT 85* 77* 77* 69* 72*   Cardiac Enzymes:  Recent Labs Lab 08/19/13 2201 08/20/13 0530 08/20/13 1001  TROPONINI <0.30 <0.30 <0.30   BNP (last 3 results)  Recent Labs  08/19/13 1440  PROBNP 650.2*   CBG:  Recent Labs Lab 08/21/13 1651 08/22/13 1250  GLUCAP 102* 103*    Recent Results (from the past 240 hour(s))  MRSA PCR SCREENING     Status: None   Collection Time    08/20/13  4:30 PM      Result Value Range Status   MRSA by PCR NEGATIVE  NEGATIVE Final   Comment:            The GeneXpert MRSA Assay (FDA     approved for NASAL specimens     only), is one component of a     comprehensive MRSA colonization     surveillance program. It is not     intended to diagnose MRSA     infection nor to guide or     monitor treatment for     MRSA infections.     Studies:  Recent x-ray studies have been reviewed in detail by the Attending Physician  Scheduled Meds:  Scheduled Meds: . antiseptic oral rinse  15 mL Mouth Rinse BID  . aspirin EC  81 mg Oral Daily  . influenza vac split quadrivalent PF  0.5 mL Intramuscular Tomorrow-1000  . iron polysaccharides  150 mg Oral Daily  . lactulose  30 g Oral BID  . mometasone-formoterol  2 puff Inhalation BID  . nadolol  20 mg Oral BID  .  pantoprazole  40 mg Oral Daily  . rifaximin  550 mg Oral BID  . spironolactone  12.5 mg Oral Daily  . tiotropium  18 mcg Inhalation Daily   Continuous Infusions: . sodium chloride 20 mL/hr at 08/21/13 0900    Time spent on care of this patient: 61  min   Calvert Cantor, MD  [facility] Office  [phone] Pager - Text Page per Amion as per below:  On-Call/Text Page:      [email or web address]      password [email or web address]  If 7PM-7AM, please contact night-coverage [email or web address] Password [email or web address] 08/22/2013, 5:55 PM   LOS: 3 days

## 2013-08-22 NOTE — Progress Notes (Signed)
DAILY PROGRESS NOTE  Subjective:  Complains of headache overnight. Blood pressure is low - required fluid boluses.  +++Anxiety, xanax was held due to hypotension.  Troponins negative.  Objective:  Temp:  [97.9 F (36.6 C)-98.7 F (37.1 C)] 98.7 F (37.1 C) (11/11 0313) Pulse Rate:  [62-77] 65 (11/11 0558) Resp:  [15-22] 20 (11/11 0558) BP: (82-130)/(35-54) 88/51 mmHg (11/11 0558) SpO2:  [92 %-100 %] 97 % (11/11 0558) Weight:  [191 lb 2.2 oz (86.7 kg)] 191 lb 2.2 oz (86.7 kg) (11/11 0423) Weight change: -1 lb 1.6 oz (-0.5 kg)  Intake/Output from previous day: 11/10 0701 - 11/11 0700 In: 1840 [P.O.:1260; I.V.:80; IV Piggyback:500] Out: 1000 [Urine:1000]  Intake/Output from this shift:    Medications: Current Facility-Administered Medications  Medication Dose Route Frequency Provider Last Rate Last Dose  . 0.9 %  sodium chloride infusion   Intravenous Continuous Nada Boozer, NP 20 mL/hr at 08/21/13 0900    . acetaminophen (TYLENOL) tablet 650 mg  650 mg Oral Q4H PRN Nada Boozer, NP   650 mg at 08/20/13 0500  . albuterol (PROVENTIL HFA;VENTOLIN HFA) 108 (90 BASE) MCG/ACT inhaler 2 puff  2 puff Inhalation Q6H PRN Nada Boozer, NP      . ALPRAZolam Prudy Feeler) tablet 0.25 mg  0.25 mg Oral Once Brittainy Simmons, PA-C      . antiseptic oral rinse (BIOTENE) solution 15 mL  15 mL Mouth Rinse BID Lennette Bihari, MD   15 mL at 08/21/13 2000  . aspirin EC tablet 81 mg  81 mg Oral Daily Nada Boozer, NP   81 mg at 08/21/13 1100  . influenza vac split quadrivalent PF (FLUARIX) injection 0.5 mL  0.5 mL Intramuscular Tomorrow-1000 Lennette Bihari, MD      . iron polysaccharides (NIFEREX) capsule 150 mg  150 mg Oral Daily Nada Boozer, NP   150 mg at 08/21/13 1100  . lactulose (CHRONULAC) 10 GM/15ML solution 30 g  30 g Oral BID Nada Boozer, NP   30 g at 08/21/13 1832  . mometasone-formoterol (DULERA) 100-5 MCG/ACT inhaler 2 puff  2 puff Inhalation BID Esperanza Sheets, MD   2 puff at  08/21/13 2031  . nadolol (CORGARD) tablet 20 mg  20 mg Oral BID Mihai Croitoru, MD   20 mg at 08/21/13 1451  . nitroGLYCERIN (NITROSTAT) SL tablet 0.4 mg  0.4 mg Sublingual Q5 Min x 3 PRN Nada Boozer, NP      . ondansetron [facility]) injection 4 mg  4 mg Intravenous Q6H PRN Nada Boozer, NP   4 mg at 08/20/13 1131  . pantoprazole (PROTONIX) EC tablet 40 mg  40 mg Oral Daily Nada Boozer, NP   40 mg at 08/21/13 1100  . rifaximin (XIFAXAN) tablet 200 mg  200 mg Oral TID Esperanza Sheets, MD   200 mg at 08/21/13 2129  . spironolactone (ALDACTONE) tablet 25 mg  25 mg Oral Daily Mihai Croitoru, MD   25 mg at 08/21/13 1451  . tiotropium (SPIRIVA) inhalation capsule 18 mcg  18 mcg Inhalation Daily Esperanza Sheets, MD   18 mcg at 08/21/13 1023    Physical Exam: General appearance: alert and no distress Neck: no carotid bruit and no JVD Lungs: diminished breath sounds bilaterally Heart: regular rate and rhythm, S1, S2 normal, no murmur, click, rub or gallop Abdomen: soft, non-tender; bowel sounds normal; no masses,  no organomegaly and obese, striaie, caput medusae Extremities: trace edema Pulses: 2+ and symmetric Skin: Skin  color, texture, turgor normal. No rashes or lesions Neurologic: Alert and oriented X 3, normal strength and tone. Normal symmetric reflexes. Normal coordination and gait Psych: Anxious  Lab Results: Results for orders placed during the hospital encounter of 08/19/13 (from the past 48 hour(s))  AMMONIA     Status: Abnormal   Collection Time    08/20/13 10:00 AM      Result Value Range   Ammonia 160 (*) 11 - 60 umol/L  TROPONIN I     Status: None   Collection Time    08/20/13 10:01 AM      Result Value Range   Troponin I <0.30  <0.30 ng/mL   Comment:            Due to the release kinetics of cTnI,     a negative result within the first hours     of the onset of symptoms does not rule out     myocardial infarction with certainty.     If myocardial infarction is still  suspected,     repeat the test at appropriate intervals.  HEPARIN LEVEL (UNFRACTIONATED)     Status: None   Collection Time    08/20/13 10:30 AM      Result Value Range   Heparin Unfractionated 0.39  0.30 - 0.70 IU/mL   Comment:            IF HEPARIN RESULTS ARE BELOW     EXPECTED VALUES, AND PATIENT     DOSAGE HAS BEEN CONFIRMED,     SUGGEST FOLLOW UP TESTING     OF ANTITHROMBIN III LEVELS.  CBC     Status: Abnormal   Collection Time    08/20/13 10:30 AM      Result Value Range   WBC 8.5  4.0 - 10.5 K/uL   RBC 3.41 (*) 3.87 - 5.11 MIL/uL   Hemoglobin 8.5 (*) 12.0 - 15.0 g/dL   HCT 16.1 (*) 09.6 - 04.5 %   MCV 84.8  78.0 - 100.0 fL   MCH 24.9 (*) 26.0 - 34.0 pg   MCHC 29.4 (*) 30.0 - 36.0 g/dL   RDW 40.9 (*) 81.1 - 91.4 %   Platelets 77 (*) 150 - 400 K/uL   Comment: CONSISTENT WITH PREVIOUS RESULT  D-DIMER, QUANTITATIVE     Status: Abnormal   Collection Time    08/20/13 10:30 AM      Result Value Range   D-Dimer, Quant 1.08 (*) 0.00 - 0.48 ug/mL-FEU   Comment:            AT THE INHOUSE ESTABLISHED CUTOFF     VALUE OF 0.48 ug/mL FEU,     THIS ASSAY HAS BEEN DOCUMENTED     IN THE LITERATURE TO HAVE     A SENSITIVITY AND NEGATIVE     PREDICTIVE VALUE OF AT LEAST     98 TO 99%.  THE TEST RESULT     SHOULD BE CORRELATED WITH     AN ASSESSMENT OF THE CLINICAL     PROBABILITY OF DVT / VTE.  MRSA PCR SCREENING     Status: None   Collection Time    08/20/13  4:30 PM      Result Value Range   MRSA by PCR NEGATIVE  NEGATIVE   Comment:            The GeneXpert MRSA Assay (FDA     approved for NASAL specimens     only), is one  component of a     comprehensive MRSA colonization     surveillance program. It is not     intended to diagnose MRSA     infection nor to guide or     monitor treatment for     MRSA infections.  IRON AND TIBC     Status: Abnormal   Collection Time    08/20/13  6:04 PM      Result Value Range   Iron 25 (*) 42 - 135 ug/dL   TIBC 161  096 - 045  ug/dL   Saturation Ratios 6 (*) 20 - 55 %   UIBC 383  125 - 400 ug/dL   Comment: Performed at [facility]  FERRITIN     Status: None   Collection Time    08/20/13  6:04 PM      Result Value Range   Ferritin 12  10 - 291 ng/mL   Comment: Performed at [facility], ROUTINE W REFLEX MICROSCOPIC     Status: Abnormal   Collection Time    08/20/13  7:49 PM      Result Value Range   Color, Urine YELLOW  YELLOW   APPearance CLEAR  CLEAR   Specific Gravity, Urine >1.046 (*) 1.005 - 1.030   pH 7.0  5.0 - 8.0   Glucose, UA NEGATIVE  NEGATIVE mg/dL   Hgb urine dipstick NEGATIVE  NEGATIVE   Bilirubin Urine NEGATIVE  NEGATIVE   Ketones, ur 15 (*) NEGATIVE mg/dL   Protein, ur NEGATIVE  NEGATIVE mg/dL   Urobilinogen, UA 4.0 (*) 0.0 - 1.0 mg/dL   Nitrite NEGATIVE  NEGATIVE   Leukocytes, UA NEGATIVE  NEGATIVE   Comment: MICROSCOPIC NOT DONE ON URINES WITH NEGATIVE PROTEIN, BLOOD, LEUKOCYTES, NITRITE, OR GLUCOSE <1000 mg/dL.  CBC     Status: Abnormal   Collection Time    08/21/13  5:10 AM      Result Value Range   WBC 6.4  4.0 - 10.5 K/uL   RBC 3.28 (*) 3.87 - 5.11 MIL/uL   Hemoglobin 8.1 (*) 12.0 - 15.0 g/dL   HCT 40.9 (*) 81.1 - 91.4 %   MCV 85.1  78.0 - 100.0 fL   MCH 24.7 (*) 26.0 - 34.0 pg   MCHC 29.0 (*) 30.0 - 36.0 g/dL   RDW 78.2 (*) 95.6 - 21.3 %   Platelets 69 (*) 150 - 400 K/uL   Comment: CONSISTENT WITH PREVIOUS RESULT  BASIC METABOLIC PANEL     Status: Abnormal   Collection Time    08/21/13  5:10 AM      Result Value Range   Sodium 139  135 - 145 mEq/L   Potassium 3.5  3.5 - 5.1 mEq/L   Chloride 94 (*) 96 - 112 mEq/L   CO2 42 (*) 19 - 32 mEq/L   Comment: CRITICAL RESULT CALLED TO, READ BACK BY AND VERIFIED WITH:     TOMLINSON,T RN @ [phone] 08/21/13 LEONARD,A   Glucose, Bld 95  70 - 99 mg/dL   BUN 10  6 - 23 mg/dL   Creatinine, Ser 7.84  0.50 - 1.10 mg/dL   Calcium 8.6  8.4 - 69.6 mg/dL   GFR calc non Af Amer >90  >90 mL/min   GFR calc Af  Amer >90  >90 mL/min   Comment: (NOTE)     The eGFR has been calculated using the CKD EPI equation.     This calculation has not been  validated in all clinical situations.     eGFR's persistently <90 mL/min signify possible Chronic Kidney     Disease.  HEPATIC FUNCTION PANEL     Status: Abnormal   Collection Time    08/21/13  5:10 AM      Result Value Range   Total Protein 6.4  6.0 - 8.3 g/dL   Albumin 2.7 (*) 3.5 - 5.2 g/dL   AST 39 (*) 0 - 37 U/L   ALT 19  0 - 35 U/L   Alkaline Phosphatase 106  39 - 117 U/L   Total Bilirubin 1.8 (*) 0.3 - 1.2 mg/dL   Bilirubin, Direct 0.6 (*) 0.0 - 0.3 mg/dL   Indirect Bilirubin 1.2 (*) 0.3 - 0.9 mg/dL  AMMONIA     Status: Abnormal   Collection Time    08/21/13  5:10 AM      Result Value Range   Ammonia 108 (*) 11 - 60 umol/L  GLUCOSE, CAPILLARY     Status: Abnormal   Collection Time    08/21/13  4:51 PM      Result Value Range   Glucose-Capillary 102 (*) 70 - 99 mg/dL  HEPATITIS PANEL, ACUTE     Status: None   Collection Time    08/21/13  7:28 PM      Result Value Range   Hepatitis B Surface Ag NEGATIVE  NEGATIVE   HCV Ab NEGATIVE  NEGATIVE   Hep A IgM NON REACTIVE  NON REACTIVE   Hep B C IgM NON REACTIVE  NON REACTIVE   Comment: (NOTE)     High levels of Hepatitis B Core IgM antibody are detectable     during the acute stage of Hepatitis B. This antibody is used     to differentiate current from past HBV infection.     Performed at [facility]  APTT     Status: Abnormal   Collection Time    08/21/13  7:28 PM      Result Value Range   aPTT 38 (*) 24 - 37 seconds   Comment:            IF BASELINE aPTT IS ELEVATED,     SUGGEST PATIENT RISK ASSESSMENT     BE USED TO DETERMINE APPROPRIATE     ANTICOAGULANT THERAPY.  AMMONIA     Status: Abnormal   Collection Time    08/22/13  4:50 AM      Result Value Range   Ammonia 80 (*) 11 - 60 umol/L    Imaging: Ct Angio Chest Pe W/cm &/or Wo Cm  08/20/2013   CLINICAL DATA:   Hypoxia, elevated D-dimer, evaluate for PE  EXAM: CT ANGIOGRAPHY CHEST WITH CONTRAST  TECHNIQUE: Multidetector CT imaging of the chest was performed using the standard protocol during bolus administration of intravenous contrast. Multiplanar CT image reconstructions including MIPs were obtained to evaluate the vascular anatomy.  CONTRAST:  80mL OMNIPAQUE IOHEXOL 350 MG/ML SOLN  COMPARISON:  Chest radiograph dated 08/19/2013  FINDINGS: No evidence of pulmonary embolism.  Mild dependent atelectasis at the lung bases. Suspected mild emphysematous changes. No suspicious pulmonary nodules. No pleural effusion or pneumothorax.  Visualized thyroid is unremarkable.  The heart is normal in size. No pericardial effusion.  Coronary atherosclerosis. Coronary atherosclerosis. Postsurgical changes related to prior CABG. Mild atherosclerotic calcifications of the aortic arch.  No suspicious mediastinal, hilar, or axillary lymphadenopathy.  Visualized upper abdomen is notable for cirrhosis, cholecystectomy clips, and suspected splenomegaly (although incompletely  visualized).  Review of the MIP images confirms the above findings.  IMPRESSION: No evidence of pulmonary embolism.  Mild emphysematous changes.  Cirrhosis with suspected splenomegaly.   Electronically Signed   By: Charline Bills M.D.   On: 08/20/2013 16:00   US Abdomen Port  08/21/2013   CLINICAL DATA:  Increased ammonia levels; nausea and vomiting.  EXAM: ULTRASOUND PORTABLE ABDOMEN  COMPARISON:  None  FINDINGS: Gallbladder  Status post cholecystectomy; no retained stones seen.  Common bile duct  Diameter: 0.8 cm; within normal limits status post cholecystectomy.  Liver  No focal lesion identified. Diffusely coarsened echotexture and increased echogenicity, compatible with fatty infiltration.  IVC  No abnormality visualized.  Pancreas  Visualized portion unremarkable.  Spleen  Enlarged, measuring 14.8 cm in length.  Right Kidney  Length: 12.8 cm. Echogenicity  within normal limits. No mass or hydronephrosis visualized.  Left Kidney  Length: 12.2 cm. Echogenicity within normal limits. No mass or hydronephrosis visualized.  Abdominal aorta  No aneurysm visualized. Not visualized distally due to overlying bowel gas.  IMPRESSION: 1. No acute abnormality seen within the abdomen. 2. Diffuse fatty infiltration within the liver; liver difficult to fully assess due to fatty infiltration. 3. Splenomegaly noted. 4. Status post cholecystectomy.   Electronically Signed   By: Roanna Raider M.D.   On: 08/21/2013 06:46    Assessment:  1. Principal Problem: 2.   Hypoxia 3. Active Problems: 4.   Anxiety disorder 5.   CAD, ? Stent at [facility] in 2001, cath 03/02/12 with 95% Lt. Main 6.   COPD (chronic obstructive pulmonary disease) 7.   Obesity 8.   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   SOB (shortness of breath) 10.   Hypokalemia 11.   Thrombocytopenia 12.   Plan:  1. Ammonia has come down nicely to 80.  She was hypotensive overnight, ?overdiuresis, change in meds + diarrhea with lactulose. Consider decreasing lactulose further today. Decrease aldactone to 12.5 mg daily. No active cardiac issues, troponin is negative. She wants to re-start xanax.  D/W Dr. Butler Denmark, she has agreed to assume care. We will arrange follow-up with Dr. Allyson Sabal after discharge.  Time Spent Directly with Patient:  15 minutes  Length of Stay:  LOS: 3 days   Chrystie Nose, MD, [facility] Attending Cardiologist [facility]  Hoda Hon C 08/22/2013, 8:16 AM

## 2013-08-23 DIAGNOSIS — I059 Rheumatic mitral valve disease, unspecified: Secondary | ICD-10-CM

## 2013-08-23 LAB — BASIC METABOLIC PANEL
BUN: 7 mg/dL (ref 6–23)
Calcium: 8.9 mg/dL (ref 8.4–10.5)
Chloride: 95 mEq/L — ABNORMAL LOW (ref 96–112)
Creatinine, Ser: 0.49 mg/dL — ABNORMAL LOW (ref 0.50–1.10)
GFR calc Af Amer: >90 mL/min (ref >90)
GFR calc non Af Amer: >90 mL/min (ref >90)
Glucose, Bld: 179 mg/dL — ABNORMAL HIGH (ref 70–99)
Potassium: 3.4 mEq/L — ABNORMAL LOW (ref 3.5–5.1)

## 2013-08-23 LAB — CBC
MCH: 24.8 pg — ABNORMAL LOW (ref 26.0–34.0)
MCHC: 29.8 g/dL — ABNORMAL LOW (ref 30.0–36.0)
MCV: 83.1 fL (ref 78.0–100.0)
Platelets: 54 10*3/uL — ABNORMAL LOW (ref 150–400)
RDW: 18.4 % — ABNORMAL HIGH (ref 11.5–15.5)
WBC: 6.4 10*3/uL (ref 4.0–10.5)

## 2013-08-23 LAB — MAGNESIUM: Magnesium: 1.8 mg/dL (ref 1.5–2.5)

## 2013-08-23 LAB — AFP TUMOR MARKER: AFP-Tumor Marker: 6.6 ng/mL (ref 0.0–8.0)

## 2013-08-23 MED ORDER — POTASSIUM CHLORIDE CRYS ER 20 MEQ PO TBCR
20.0000 meq | EXTENDED_RELEASE_TABLET | Freq: Once | ORAL | Status: AC
  Administered 2013-08-23: 16:00:00 20 meq via ORAL
  Filled 2013-08-23: qty 1

## 2013-08-23 MED ORDER — LACTULOSE 10 GM/15ML PO SOLN
10.0000 g | Freq: Two times a day (BID) | ORAL | Status: DC
  Administered 2013-08-23 – 2013-08-24 (×2): 10 g via ORAL
  Filled 2013-08-23 (×3): qty 15

## 2013-08-23 NOTE — Progress Notes (Signed)
Physical Therapy Evaluation Patient Details Name: Victoria Bell MRN: [id number] DOB: 1961/09/05 Today's Date: 08/23/2013 Time: 7829-5621 PT Time Calculation (min): 22 min  PT Assessment / Plan / Recommendation History of Present Illness  52 y/o female with PMH of HTN, HPL, CAD s/p CABG, COPD/active smoker, liver cirrhosis, Hep C who presented with volume overload due to CHF, with hypoxia and confusion who was admitted and is being diuresed by Cardiology.  The Hospitalist service was asked to consult for evaluation of her confusion.  Clinical Impression  Pt admitted with above. Pt currently with functional limitations due to the deficits listed below (see PT Problem List). Pt desat with activity.  Will most likely need home O2.  Sister states she can help pt at home.  Pt actually may benefit from a RW for ultimate stability however pt does not feel she needs one at this time and is fairly steady without one at present.  Pt and sister decline HHPT f/u.  Pt will benefit from skilled PT to increase their independence and safety with mobility to allow discharge to the venue listed below.     PT Assessment  Patient needs continued PT services    Follow Up Recommendations  No PT follow up;Supervision/Assistance - 24 hour                Equipment Recommendations  None recommended by PT         Frequency Min 3X/week    Precautions / Restrictions Precautions Precautions: None Restrictions Weight Bearing Restrictions: No   Pertinent Vitals/Pain O2 desat to 85% on RA with activity.  O2 94% on 2L with activity.  No pain      Mobility  Bed Mobility Bed Mobility: Rolling Left;Left Sidelying to Sit;Sitting - Scoot to Edge of Bed Rolling Left: 5: Supervision Left Sidelying to Sit: 5: Supervision Transfers Transfers: Sit to Stand;Stand to Sit Sit to Stand: 4: Min guard;With upper extremity assist;From bed Stand to Sit: 4: Min guard;With upper extremity assist;To bed Details for Transfer  Assistance: Steadying assist needed as pt states she hasnt been up and felt unsteady. Ambulation/Gait Ambulation/Gait Assistance: 4: Min assist Ambulation Distance (Feet): 50 Feet Assistive device: None Ambulation/Gait Assistance Details: Pt ambulating with slight unsteadiness on feet but no significant LOB.  Pt feels it is just from not being up.   Gait Pattern: Step-to pattern;Decreased stride length Gait velocity: decreased Stairs: No Wheelchair Mobility Wheelchair Mobility: No         PT Diagnosis: Generalized weakness  PT Problem List: Decreased activity tolerance;Decreased mobility;Decreased knowledge of use of DME;Decreased safety awareness;Decreased knowledge of precautions PT Treatment Interventions: DME instruction;Gait training;Functional mobility training;Stair training;Therapeutic activities;Therapeutic exercise;Balance training;Patient/family education     PT Goals(Current goals can be found in the care plan section) Acute Rehab PT Goals Patient Stated Goal: to go home PT Goal Formulation: With patient Time For Goal Achievement: 08/30/13 Potential to Achieve Goals: Good  Visit Information  Last PT Received On: 08/23/13 Assistance Needed: +1 History of Present Illness: 51 y/o female with PMH of HTN, HPL, CAD s/p CABG, COPD/active smoker, liver cirrhosis, Hep C who presented with volume overload due to CHF, with hypoxia and confusion who was admitted and is being diuresed by Cardiology.  The Hospitalist service was asked to consult for evaluation of her confusion.       Prior Functioning  Home Living Family/patient expects to be discharged to:: Private residence Living Arrangements: Other relatives (sister) Available Help at Discharge: Family;Available 24 hours/day Type of  Home: Mobile home Home Access: Stairs to enter Entrance Stairs-Number of Steps: 6 Entrance Stairs-Rails: Can reach both Home Layout: One level Home Equipment: None Prior Function Level of  Independence: Independent Communication Communication: No difficulties    Cognition  Cognition Arousal/Alertness: Awake/alert Behavior During Therapy: WFL for tasks assessed/performed Overall Cognitive Status: Within Functional Limits for tasks assessed    Extremity/Trunk Assessment Upper Extremity Assessment Upper Extremity Assessment: Defer to OT evaluation Lower Extremity Assessment Lower Extremity Assessment: Generalized weakness Cervical / Trunk Assessment Cervical / Trunk Assessment: Normal   Balance Balance Balance Assessed: Yes Static Standing Balance Static Standing - Balance Support: During functional activity;Bilateral upper extremity supported Static Standing - Level of Assistance: 5: Stand by assistance Static Standing - Comment/# of Minutes: 2 High Level Balance High Level Balance Activites: Direction changes;Turns;Sudden stops High Level Balance Comments: Can perform all with supervision only  End of Session PT - End of Session Equipment Utilized During Treatment: Gait belt;Oxygen Activity Tolerance: Patient limited by fatigue Patient left: in bed;with call bell/phone within reach;with family/visitor present Nurse Communication: Mobility status       INGOLD,Kionna Brier 08/23/2013, 9:14 AM [facility] [phone] (pager)

## 2013-08-23 NOTE — Progress Notes (Signed)
The patient was up to the [facility] frequently during the night due to the Lactulose.  She appeared to do ok without Xanax and was able to sleep some once the Lactulose wore off.  Her blood pressure manually was 110/60 at the beginning of the shift.  The [facility] does not read the patient's blood pressure well and her blood pressure is higher in her left arm than in her right arm.  The patient did not have any complaints of pain overnight.  The patient and her sister had an argument during the night and her sister left, but returned this morning.

## 2013-08-23 NOTE — Progress Notes (Signed)
  Echocardiogram 2D Echocardiogram has been performed.  Cathie Beams 08/23/2013, 2:03 PM

## 2013-08-23 NOTE — Progress Notes (Signed)
   Cardiology has signed off. Call if questions.

## 2013-08-23 NOTE — Progress Notes (Signed)
[facility] Progress Note  Metha Kolasa YNW:[id number] DOB: 1961-03-16 DOA: 08/19/2013 PCP: Runell Gess, MD  Brief narrative: 52 y/o female admitted on 11/8 to the cardiology service for hypoxia and acute pulmonary edema. She was started on Diuretics and has serial troponins to evaluate for ACS. These were negative and it appears that the fluid overload was being managed appropriately. However, she became confused and therefore a medicine consult was requested. She was found to have an elevated ammonia level signifying hepatic encephalopath and treated with Lactulose and Xifaxin. Prior to today, the patient was not aware of having cirrhosis of the liver.   Assessment/Plan: Hypoxia - underlying COPD? - possible that it was due to pulm edema however, last ECHO in 2013 did not show and systolic or diastolic CHF - currently resolved - cardiology has signed off for now as not further cardiac work up needed - Aldactone has been decreased by Cardio to 12.5 - not sure why pt on Lasix at home- will need repeat ECHO to evaluate further - echo pending. 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  - cont Xifaxin and Lactulose for encephalopathy - cont Nadalol for possible varices - no ascites noted on Ultrasound or on CT Chest (upper abdomen) - check AFP Hypotension - may be in the setting of #2; dehydration possible given diarrhea due to lactulose Diarrhea - persistent, uncomfortable - decrease lactulose Anxiety disorder - sever anxiety after hearing that she has cirrhosis although I did explain that it can be medically managed - have advised limited use of Xanax (she usually only takes one 1mg  tab daily at home) - have ordered 0.5 mg Xanax PRN for now CAD, ? Stent at [facility] in 2001, cath 03/02/12 with 95% Lt. 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 OSA- needs outpt PFTs to confirm  GOLD Stage and possible sleep study Obesity- have strictly advised to lose weight and have explained relationship between fatty liver and obesity Hypokalemia- replace carefully as pt on Aldactone Thrombocytopenia- likely due to cirrhosis; no bleeding Chronic pain- takes less than ordered Oxycodone at hom (onces or twice a day) Anemia with Iron deficiency - on oral replacement Nicotine abuse   Code Status: full code Family Communication: sister Disposition Plan: home when stable, perhaps 11/13 pending 2D echo  DVT prophylaxis: SCDs  HPI/Subjective: - no complaints other than lightheadedness  Objective: Blood pressure 98/41, pulse 69, temperature 98.1 F (36.7 C), temperature source Oral, resp. rate 18, height 5\' 1"  (1.549 m), weight 96.2 kg (212 lb 1.3 oz), SpO2 97.00%.  Intake/Output Summary (Last 24 hours) at 08/23/13 1302 Last data filed at 08/23/13 1100  Gross per 24 hour  Intake    960 ml  Output    827 ml  Net    133 ml   Exam: General: No acute respiratory distress, morbidly obese Lungs: Clear to auscultation bilaterally without wheezes or crackles Cardiovascular: Regular rate and rhythm without murmur gallop or rub normal S1 and S2 Abdomen: Nontender, nondistended, soft, bowel sounds positive, no rebound, no ascites, no appreciable mass Extremities: No significant cyanosis, clubbing, or edema bilateral lower extremities  Data Reviewed: Basic Metabolic Panel:  Recent Labs Lab 08/19/13 1440 08/19/13 2201 08/20/13 0500 08/21/13 0510 08/22/13 0840 08/23/13 1027  NA 138  --  139 139 139 137  K 3.2*  --  3.6 3.5 3.3*  3.4*  CL 89*  --  93* 94* 96 95*  CO2 43*  --  40* 42* 39* 32  GLUCOSE 168*  --  137* 95 91 179*  BUN 13  --  11 10 9 7   CREATININE 0.52  --  0.50 0.51 0.56 0.49*  CALCIUM 8.6  --  8.3* 8.6 8.8 8.9  MG 1.8 2.0  --   --   --  1.8   Liver Function Tests:  Recent Labs Lab 08/19/13 1440 08/20/13 0500 08/21/13 0510 08/22/13 0840  AST 43* 51*  39* 34  ALT 23 22 19 17   ALKPHOS 131* 116 106 99  BILITOT 1.6* 1.7* 1.8* 1.7*  PROT 6.8 6.7 6.4 6.3  ALBUMIN 2.9* 2.9* 2.7* 2.7*    Recent Labs Lab 08/20/13 1000 08/21/13 0510 08/22/13 0450  AMMONIA 160* 108* 80*   CBC:  Recent Labs Lab 08/19/13 1440 08/20/13 0530 08/20/13 1030 08/21/13 0510 08/22/13 0840 08/23/13 1027  WBC 9.2 8.3 8.5 6.4 7.1 6.4  NEUTROABS 6.8  --   --   --   --   --   HGB 9.1* 8.5* 8.5* 8.1* 8.0* 8.2*  HCT 31.0* 29.0* 28.9* 27.9* 27.4* 27.5*  MCV 84.9 85.0 84.8 85.1 83.5 83.1  PLT 85* 77* 77* 69* 72* 54*   Cardiac Enzymes:  Recent Labs Lab 08/19/13 2201 08/20/13 0530 08/20/13 1001  TROPONINI <0.30 <0.30 <0.30   BNP (last 3 results)  Recent Labs  08/19/13 1440  PROBNP 650.2*   CBG:  Recent Labs Lab 08/21/13 1651 08/22/13 1250  GLUCAP 102* 103*    Recent Results (from the past 240 hour(s))  MRSA PCR SCREENING     Status: None   Collection Time    08/20/13  4:30 PM      Result Value Range Status   MRSA by PCR NEGATIVE  NEGATIVE Final   Comment:            The GeneXpert MRSA Assay (FDA     approved for NASAL specimens     only), is one component of a     comprehensive MRSA colonization     surveillance program. It is not     intended to diagnose MRSA     infection nor to guide or     monitor treatment for     MRSA infections.     Studies: Recent x-ray studies have been reviewed in detail by the Attending Physician  Scheduled Meds:  Scheduled Meds: . antiseptic oral rinse  15 mL Mouth Rinse BID  . aspirin EC  81 mg Oral Daily  . influenza vac split quadrivalent PF  0.5 mL Intramuscular Tomorrow-1000  . iron polysaccharides  150 mg Oral Daily  . lactulose  10 g Oral BID  . mometasone-formoterol  2 puff Inhalation BID  . nadolol  20 mg Oral BID  . pantoprazole  40 mg Oral Daily  . rifaximin  550 mg Oral BID  . spironolactone  12.5 mg Oral Daily  . tiotropium  18 mcg Inhalation Daily   Continuous Infusions: .  sodium chloride 20 mL/hr at 08/21/13 0900   Time spent on care of this patient: 25 min  Pamella Pert, MD [phone] If 7PM-7AM, please contact night-coverage [email or web address] Password [email or web address] 08/23/2013, 1:02 PM   LOS: 4 days

## 2013-08-23 NOTE — Progress Notes (Signed)
SATURATION QUALIFICATIONS: (This note is used to comply with regulatory documentation for home oxygen)  Patient Saturations on Room Air at Rest = 95%  Patient Saturations on Room Air while Ambulating = 85-86%  Patient Saturations on 2 Liters of oxygen while Ambulating = 94%  Please briefly explain why patient needs home oxygen:Pt desats on RA without O2.  With O2 with activity, O2 >90%. Will need home O2.  Thanks. [facility] [phone] (pager)

## 2013-08-24 DIAGNOSIS — J9600 Acute respiratory failure, unspecified whether with hypoxia or hypercapnia: Secondary | ICD-10-CM

## 2013-08-24 LAB — CBC
HCT: 25.8 % — ABNORMAL LOW (ref 36.0–46.0)
MCH: 25.6 pg — ABNORMAL LOW (ref 26.0–34.0)
MCHC: 31 g/dL (ref 30.0–36.0)
MCV: 82.4 fL (ref 78.0–100.0)
Platelets: 58 10*3/uL — ABNORMAL LOW (ref 150–400)
RDW: 18.8 % — ABNORMAL HIGH (ref 11.5–15.5)
WBC: 5.6 10*3/uL (ref 4.0–10.5)

## 2013-08-24 LAB — COMPREHENSIVE METABOLIC PANEL
ALT: 18 U/L (ref 0–35)
AST: 37 U/L (ref 0–37)
Calcium: 8.7 mg/dL (ref 8.4–10.5)
Creatinine, Ser: 0.51 mg/dL (ref 0.50–1.10)
GFR calc Af Amer: >90 mL/min (ref >90)
GFR calc non Af Amer: >90 mL/min (ref >90)
Glucose, Bld: 82 mg/dL (ref 70–99)
Sodium: 138 mEq/L (ref 135–145)
Total Protein: 6.2 g/dL (ref 6.0–8.3)

## 2013-08-24 LAB — PROTIME-INR
INR: 1.29 (ref 0.00–1.49)
Prothrombin Time: 15.8 seconds — ABNORMAL HIGH (ref 11.6–15.2)

## 2013-08-24 MED ORDER — NADOLOL 20 MG PO TABS: 20.0000 mg | ORAL_TABLET | Freq: Two times a day (BID) | ORAL | Status: AC

## 2013-08-24 MED ORDER — LACTULOSE 10 GM/15ML PO SOLN: 10.0000 g | Freq: Two times a day (BID) | ORAL | Status: AC

## 2013-08-24 MED ORDER — POTASSIUM CHLORIDE CRYS ER 20 MEQ PO TBCR
40.0000 meq | EXTENDED_RELEASE_TABLET | Freq: Two times a day (BID) | ORAL | Status: DC
  Administered 2013-08-24: 40 meq via ORAL

## 2013-08-24 MED ORDER — SPIRONOLACTONE 25 MG PO TABS
25.0000 mg | ORAL_TABLET | Freq: Every day | ORAL | Status: DC
  Administered 2013-08-24: 25 mg via ORAL
  Filled 2013-08-24: qty 1

## 2013-08-24 MED ORDER — SPIRONOLACTONE 25 MG PO TABS: 25.0000 mg | ORAL_TABLET | Freq: Every day | ORAL | Status: AC

## 2013-08-24 MED ORDER — FERROUS SULFATE 325 (65 FE) MG PO TABS: 325.0000 mg | ORAL_TABLET | Freq: Every day | ORAL | Status: AC

## 2013-08-24 MED ORDER — RIFAXIMIN 550 MG PO TABS: 550.0000 mg | ORAL_TABLET | Freq: Two times a day (BID) | ORAL | Status: AC

## 2013-08-24 MED ORDER — RIFAXIMIN 550 MG PO TABS: 550.0000 mg | ORAL_TABLET | Freq: Two times a day (BID) | ORAL | Status: DC

## 2013-08-24 MED ORDER — ALPRAZOLAM 1 MG PO TABS: 0.5000 mg | ORAL_TABLET | Freq: Two times a day (BID) | ORAL | Status: AC | PRN

## 2013-08-24 NOTE — Progress Notes (Signed)
Went over all discharge instructions with pt and her sister. Pt aware of follow up appt with cardiologist. Knows about her home meds and what to take for the rest of  the day. Able to wean 02 today on RA pt was 95 % saturated.

## 2013-08-24 NOTE — Plan of Care (Signed)
Problem: Phase I Progression Outcomes Goal: EF % per last Echo/documented,Core Reminder form on chart Outcome: Completed/Met Date Met:  08/24/13 55-60%(08-23-13)

## 2013-08-24 NOTE — Discharge Summary (Addendum)
Physician Discharge Summary  Victoria Bell ZOX:[id number] DOB: May 09, 1961 DOA: 08/19/2013  PCP: Runell Gess, MD  Admit date: 08/19/2013 Discharge date: 08/24/2013  Time spent: 35 minutes  Recommendations for Outpatient Follow-up:  1. Follow up with Cards in 1 week.  2. Gastro enterology 2-4 weeks.  Discharge Diagnoses:  Principal Problem:   Acute respiratory failure with hypoxia Active Problems:   Anxiety disorder   CAD, ? Stent at [facility] in 2001, cath 03/02/12 with 95% Lt. 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 female with a history of coronary artery disease, status post MI about 12 years ago, treated at [facility]. The patient was found to have elevated troponins in 02/2012 at her hospital was transferred to [facility] for cardiology work up, as there were no beds available at the hospitals surrounding [place]. Cardiac catheterization revealed 99% distal left main stenosis with normal LV function. She underwent coronary artery bypass grafting x2 with Dr. Zenaida Niece tripe 03/03/2012 LIMA to the LAD and vein graft to the circumflex. She did have postop A. fib and persistent left pleural effusion. She was also hypertensive at times. She has a history of hepatitis C. She was last seen by Dr. Allyson Sabal March 2014 at that time she had started smoking again. She was instructed to stop and she was started on low-dose beta blocker.    Hospital Course:  Acute respiratory failure with Hypoxia  - underlying COPD? Vs Obesity hypoventilation  Syndrome vs unintentional overdose. - possible that it was due to  pulm edema however, last ECHO in 2013 did not show and systolic or diastolic CHF  - Follow up with cards as an outpatinet - Echo: grade 2 diastolic heart failure.  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  - cont Xifaxin and Lactulose for encephalopathy  - cont Nadalol for possible varices  - no ascites noted on Ultrasound or on CT Chest  - negative AFP, she will like to follow up with GI at [facility] close to [place] were she lives.   Hypotension  - may be in the setting of #2; dehydration possible given diarrhea due to lactulose   Diarrhea  - due to lactulose.  Anxiety disorder  - sever anxiety after hearing that she has cirrhosis although I did explain that it can be medically managed  - have advised limited use of Xanax (she usually only takes one 1mg  tab daily at home)  - have ordered 0.5 mg Xanax PRN.  CAD,  - ? Stent at [facility] in 2001, cath 03/02/12 with 95% Lt. 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 OSA - needs outpt PFTs to confirm GOLD Stage and possible sleep study   Procedures: Echo 11.13.2014: ejection fraction was in the range of 55% to 60%. Wall motion was normal; there were no regional wall motion abnormalities. Features are consistent with a  pseudonormal left ventricular filling pattern, with concomitant abnormal relaxation and increased filling pressure (grade 2 diastolic dysfunction   Consultations:  Cards  Discharge Exam: Filed Vitals:   08/24/13 1400  BP: 91/46  Pulse: 68  Temp: 98.4 F (36.9 C)  Resp: 18    General: A&O x3 Cardiovascular: RRR Respiratory: good air movement CTA B/L  Discharge Instructions      Discharge Orders   Future Appointments Provider Department Dept Phone   09/14/2013 2:15 PM Runell Gess, MD [facility] [phone]   Future Orders Complete By Expires   Diet - low sodium heart healthy  As  directed    Increase activity slowly  As directed        Medication List    STOP taking these medications       metoprolol tartrate 25 MG tablet  Commonly known as:  LOPRESSOR      TAKE these medications       albuterol 108 (90 BASE) MCG/ACT inhaler  Commonly known as:  PROVENTIL HFA;VENTOLIN HFA  Inhale 2 puffs into the lungs every 6 (six) hours as needed for wheezing or shortness of breath.     ALPRAZolam 1 MG tablet  Commonly known as:  XANAX  Take 0.5 tablets (0.5 mg total) by mouth 2 (two) times daily as needed for anxiety.     aspirin EC 81 MG tablet  Take 81 mg by mouth daily.     ferrous sulfate 325 (65 FE) MG tablet  Take 1 tablet (325 mg total) by mouth daily with breakfast.     furosemide 40 MG tablet  Commonly known as:  LASIX  Take 1 tablet (40 mg total) by mouth daily with breakfast.     iron polysaccharides 150 MG capsule  Commonly known as:  NIFEREX  Take 1 capsule (150 mg total) by mouth daily.     lactulose 10 GM/15ML solution  Commonly known as:  CHRONULAC  Take 15 mLs (10 g total) by mouth 2 (two) times daily.     meclizine 12.5 MG tablet  Commonly known as:  ANTIVERT  Take 12.5 mg by mouth 2 (two) times daily as needed for dizziness.     nadolol 20 MG tablet  Commonly known as:  CORGARD  Take 1 tablet (20 mg total) by mouth 2 (two) times daily.     ondansetron 8 MG tablet  Commonly known as:  ZOFRAN  Take 4-8 mg by mouth every 8 (eight) hours as needed for nausea or vomiting.     Oxycodone HCl 10 MG Tabs  Take 10 mg by mouth 3 (three) times daily.     oxymetazoline 0.05 % nasal spray  Commonly known as:  AFRIN  Place 1 spray into both nostrils 2 (two) times daily.     pantoprazole 40 MG tablet  Commonly known as:  PROTONIX  Take 40 mg by mouth daily.     rifaximin 550 MG Tabs tablet  Commonly known as:  XIFAXAN  Take 1 tablet (550 mg total) by mouth 2 (two) times daily.     simvastatin 20 MG tablet  Commonly known as:  ZOCOR   Take 20 mg by mouth daily.     spironolactone 25 MG tablet  Commonly known as:  ALDACTONE  Take 1 tablet (25 mg total) by mouth daily.       No Known Allergies Follow-up Information   Follow up with Runell Gess, MD On 09/14/2013. (at 2:15 pm)    Specialty:  Cardiology   Contact information:   [address] [place] [address] [phone]        The results of significant diagnostics from this hospitalization (including imaging, microbiology, ancillary and laboratory) are listed below for reference.    Significant Diagnostic Studies: Ct Angio Chest Pe W/cm &/or Wo Cm  08/20/2013   CLINICAL DATA:  Hypoxia, elevated D-dimer, evaluate for PE  EXAM: CT ANGIOGRAPHY CHEST WITH CONTRAST  TECHNIQUE: Multidetector CT imaging of the chest was performed using the standard protocol during bolus administration of intravenous contrast. Multiplanar CT image reconstructions including MIPs were obtained to evaluate the vascular anatomy.  CONTRAST:  80mL OMNIPAQUE IOHEXOL 350 MG/ML SOLN  COMPARISON:  Chest radiograph dated 08/19/2013  FINDINGS: No evidence of pulmonary embolism.  Mild dependent atelectasis at the lung bases. Suspected mild emphysematous changes. No suspicious pulmonary nodules. No pleural effusion or pneumothorax.  Visualized thyroid is unremarkable.  The heart is normal in size. No pericardial effusion.  Coronary atherosclerosis. Coronary atherosclerosis. Postsurgical changes related to prior CABG. Mild atherosclerotic calcifications of the aortic arch.  No suspicious mediastinal, hilar, or axillary lymphadenopathy.  Visualized upper abdomen is notable for cirrhosis, cholecystectomy clips, and suspected splenomegaly (although incompletely visualized).  Review of the MIP images confirms the above findings.  IMPRESSION: No evidence of pulmonary embolism.  Mild emphysematous changes.  Cirrhosis with suspected splenomegaly.   Electronically Signed   By: Charline Bills  M.D.   On: 08/20/2013 16:00   US Abdomen Port  08/21/2013   CLINICAL DATA:  Increased ammonia levels; nausea and vomiting.  EXAM: ULTRASOUND PORTABLE ABDOMEN  COMPARISON:  None  FINDINGS: Gallbladder  Status post cholecystectomy; no retained stones seen.  Common bile duct  Diameter: 0.8 cm; within normal limits status post cholecystectomy.  Liver  No focal lesion identified. Diffusely coarsened echotexture and increased echogenicity, compatible with fatty infiltration.  IVC  No abnormality visualized.  Pancreas  Visualized portion unremarkable.  Spleen  Enlarged, measuring 14.8 cm in length.  Right Kidney  Length: 12.8 cm. Echogenicity within normal limits. No mass or hydronephrosis visualized.  Left Kidney  Length: 12.2 cm. Echogenicity within normal limits. No mass or hydronephrosis visualized.  Abdominal aorta  No aneurysm visualized. Not visualized distally due to overlying bowel gas.  IMPRESSION: 1. No acute abnormality seen within the abdomen. 2. Diffuse fatty infiltration within the liver; liver difficult to fully assess due to fatty infiltration. 3. Splenomegaly noted. 4. Status post cholecystectomy.   Electronically Signed   By: Roanna Raider M.D.   On: 08/21/2013 06:46   Dg Chest Port 1 View  08/19/2013   CLINICAL DATA:  Shortness of breath  EXAM: PORTABLE CHEST - 1 VIEW  COMPARISON:  04/04/2012  FINDINGS: Status post CABG. Mild cardiac enlargement. Mild to moderate vascular congestion. No definite pulmonary edema currently.  IMPRESSION: Cardiomegaly with vascular congestion.   Electronically Signed   By: Esperanza Heir M.D.   On: 08/19/2013 15:32    Microbiology: Recent Results (from the past 240 hour(s))  MRSA PCR SCREENING     Status: None   Collection Time    08/20/13  4:30 PM      Result Value Range Status   MRSA by PCR NEGATIVE  NEGATIVE Final   Comment:            The GeneXpert MRSA Assay (FDA     approved for NASAL specimens     only), is one component of a     comprehensive  MRSA colonization     surveillance program. It is not     intended to diagnose MRSA     infection nor to guide or     monitor treatment for     MRSA infections.     Labs: Basic Metabolic Panel:  Recent Labs Lab 08/19/13 1440 08/19/13 2201 08/20/13 0500 08/21/13 0510 08/22/13 0840 08/23/13 1027 08/24/13 0500  NA 138  --  139 139 139 137 138  K 3.2*  --  3.6 3.5 3.3* 3.4* 3.3*  CL 89*  --  93* 94* 96 95* 98  CO2 43*  --  40* 42* 39* 32 30  GLUCOSE 168*  --  137* 95 91 179* 82  BUN 13  --  11 10 9 7 7   CREATININE 0.52  --  0.50 0.51 0.56 0.49* 0.51  CALCIUM 8.6  --  8.3* 8.6 8.8 8.9 8.7  MG 1.8 2.0  --   --   --  1.8  --    Liver Function Tests:  Recent Labs Lab 08/19/13 1440 08/20/13 0500 08/21/13 0510 08/22/13 0840 08/24/13 0500  AST 43* 51* 39* 34 37  ALT 23 22 19 17 18   ALKPHOS 131* 116 106 99 94  BILITOT 1.6* 1.7* 1.8* 1.7* 1.4*  PROT 6.8 6.7 6.4 6.3 6.2  ALBUMIN 2.9* 2.9* 2.7* 2.7* 2.7*   No results found for this basename: LIPASE, AMYLASE,  in the last 168 hours  Recent Labs Lab 08/20/13 1000 08/21/13 0510 08/22/13 0450  AMMONIA 160* 108* 80*   CBC:  Recent Labs Lab 08/19/13 1440  08/20/13 1030 08/21/13 0510 08/22/13 0840 08/23/13 1027 08/24/13 0500  WBC 9.2  < > 8.5 6.4 7.1 6.4 5.6  NEUTROABS 6.8  --   --   --   --   --   --   HGB 9.1*  < > 8.5* 8.1* 8.0* 8.2* 8.0*  HCT 31.0*  < > 28.9* 27.9* 27.4* 27.5* 25.8*  MCV 84.9  < > 84.8 85.1 83.5 83.1 82.4  PLT 85*  < > 77* 69* 72* 54* 58*  < > = values in this interval not displayed. Cardiac Enzymes:  Recent Labs Lab 08/19/13 2201 08/20/13 0530 08/20/13 1001  TROPONINI <0.30 <0.30 <0.30   BNP: BNP (last 3 results)  Recent Labs  08/19/13 1440  PROBNP 650.2*   CBG:  Recent Labs Lab 08/21/13 1651 08/22/13 1250  GLUCAP 102* 103*       Signed:  FELIZ ORTIZ, Joseph Johns  [facility] 08/24/2013, 3:23 PM

## 2013-08-24 NOTE — Progress Notes (Signed)
Pt discharged per w/c with all belongings including cellphone and charger. Has all d/c paperwork including prescriptions. Accompanied  by sister and nurse tech.

## 2013-08-25 NOTE — Progress Notes (Addendum)
Prior approval authorization attempted for Rifaximin 550mg  PO BID.  Medication not covered by Medicare. [employer] J. Lucretia Roers, RN, BSN, [employer] [id number].

## 2013-09-14 ENCOUNTER — Ambulatory Visit: Payer: Medicaid Other | Admitting: Cardiovascular Disease

## 2013-09-28 ENCOUNTER — Ambulatory Visit: Payer: Medicaid Other | Admitting: Cardiovascular Disease

## 2013-10-20 ENCOUNTER — Ambulatory Visit: Payer: Medicaid Other | Admitting: Cardiovascular Disease

## 2013-11-17 ENCOUNTER — Ambulatory Visit: Payer: Medicaid Other | Admitting: Cardiovascular Disease

## 2014-01-16 ENCOUNTER — Telehealth: Payer: Self-pay | Admitting: Cardiovascular Disease

## 2014-02-01 NOTE — Telephone Encounter (Signed)
Closed encounter °

## 2014-09-20 ENCOUNTER — Encounter (HOSPITAL_COMMUNITY): Payer: Self-pay | Admitting: Cardiovascular Disease

## 2015-07-13 DEATH — deceased
